# Patient Record
Sex: Male | Born: 1970 | Race: Black or African American | Hispanic: No | Marital: Married | Smoking: Current every day smoker
Health system: Southern US, Community
[De-identification: ages and names within clinical notes are randomized; demographics above are authoritative.]

## PROBLEM LIST (undated history)

## (undated) DIAGNOSIS — I10 Essential (primary) hypertension: Secondary | ICD-10-CM

## (undated) DIAGNOSIS — Z8679 Personal history of other diseases of the circulatory system: Secondary | ICD-10-CM

## (undated) DIAGNOSIS — F1910 Other psychoactive substance abuse, uncomplicated: Secondary | ICD-10-CM

## (undated) DIAGNOSIS — I480 Paroxysmal atrial fibrillation: Secondary | ICD-10-CM

## (undated) DIAGNOSIS — E781 Pure hyperglyceridemia: Secondary | ICD-10-CM

## (undated) DIAGNOSIS — Z87898 Personal history of other specified conditions: Secondary | ICD-10-CM

## (undated) HISTORY — DX: Pure hyperglyceridemia: E78.1

## (undated) HISTORY — PX: WISDOM TOOTH EXTRACTION: SHX21

## (undated) HISTORY — DX: Personal history of other specified conditions: Z87.898

## (undated) HISTORY — DX: Personal history of other diseases of the circulatory system: Z86.79

## (undated) HISTORY — PX: CARDIOVERSION: SHX1299

---

## 2000-04-10 ENCOUNTER — Emergency Department (HOSPITAL_COMMUNITY): Admission: EM | Admit: 2000-04-10 | Discharge: 2000-04-10 | Payer: Self-pay | Admitting: Emergency Medicine

## 2000-04-10 ENCOUNTER — Encounter: Payer: Self-pay | Admitting: Emergency Medicine

## 2001-05-20 ENCOUNTER — Emergency Department (HOSPITAL_COMMUNITY): Admission: EM | Admit: 2001-05-20 | Discharge: 2001-05-20 | Payer: Self-pay

## 2002-03-16 ENCOUNTER — Emergency Department (HOSPITAL_COMMUNITY): Admission: EM | Admit: 2002-03-16 | Discharge: 2002-03-16 | Payer: Self-pay | Admitting: Emergency Medicine

## 2002-03-16 ENCOUNTER — Encounter: Payer: Self-pay | Admitting: *Deleted

## 2002-04-27 ENCOUNTER — Ambulatory Visit (HOSPITAL_COMMUNITY): Admission: RE | Admit: 2002-04-27 | Discharge: 2002-04-27 | Payer: Self-pay | Admitting: *Deleted

## 2002-04-27 ENCOUNTER — Encounter: Payer: Self-pay | Admitting: *Deleted

## 2010-02-13 ENCOUNTER — Emergency Department (HOSPITAL_COMMUNITY): Admission: EM | Admit: 2010-02-13 | Discharge: 2010-02-13 | Payer: Self-pay | Admitting: Emergency Medicine

## 2010-07-04 LAB — CBC
HCT: 45.2 % (ref 39.0–52.0)
Hemoglobin: 15.8 g/dL (ref 13.0–17.0)
MCH: 31.7 pg (ref 26.0–34.0)
MCHC: 35 g/dL (ref 30.0–36.0)
MCV: 90.8 fL (ref 78.0–100.0)
Platelets: 256 10*3/uL (ref 150–400)
RBC: 4.98 MIL/uL (ref 4.22–5.81)
RDW: 14.6 % (ref 11.5–15.5)
WBC: 8 10*3/uL (ref 4.0–10.5)

## 2010-07-04 LAB — COMPREHENSIVE METABOLIC PANEL
AST: 23 U/L (ref 0–37)
Albumin: 3.9 g/dL (ref 3.5–5.2)
Alkaline Phosphatase: 41 U/L (ref 39–117)
Chloride: 105 mEq/L (ref 96–112)
Creatinine, Ser: 1.17 mg/dL (ref 0.4–1.5)
GFR calc Af Amer: >60 mL/min (ref >60)
Potassium: 4.1 mEq/L (ref 3.5–5.1)
Total Bilirubin: 0.6 mg/dL (ref 0.3–1.2)
Total Protein: 6.4 g/dL (ref 6.0–8.3)

## 2010-07-04 LAB — CK TOTAL AND CKMB (NOT AT ARMC)
CK, MB: 2.5 ng/mL (ref 0.3–4.0)
Relative Index: 0.4 (ref 0.0–2.5)
Total CK: 659 U/L — ABNORMAL HIGH (ref 7–232)

## 2010-07-04 LAB — DIFFERENTIAL
Basophils Absolute: 0.1 10*3/uL (ref 0.0–0.1)
Eosinophils Relative: 2 % (ref 0–5)
Lymphocytes Relative: 43 % (ref 12–46)
Monocytes Absolute: 0.7 10*3/uL (ref 0.1–1.0)
Monocytes Relative: 9 % (ref 3–12)

## 2010-07-04 LAB — TROPONIN I: Troponin I: 0.01 ng/mL (ref 0.00–0.06)

## 2010-07-04 LAB — TSH: TSH: 1.045 u[IU]/mL (ref 0.350–4.500)

## 2010-07-04 LAB — APTT: aPTT: 32 seconds (ref 24–37)

## 2010-07-04 LAB — PROTIME-INR: Prothrombin Time: 13.5 seconds (ref 11.6–15.2)

## 2011-07-31 ENCOUNTER — Encounter (HOSPITAL_BASED_OUTPATIENT_CLINIC_OR_DEPARTMENT_OTHER): Payer: Self-pay | Admitting: *Deleted

## 2011-07-31 ENCOUNTER — Emergency Department (INDEPENDENT_AMBULATORY_CARE_PROVIDER_SITE_OTHER): Payer: 59

## 2011-07-31 ENCOUNTER — Inpatient Hospital Stay (HOSPITAL_BASED_OUTPATIENT_CLINIC_OR_DEPARTMENT_OTHER)
Admission: EM | Admit: 2011-07-31 | Discharge: 2011-08-01 | DRG: 309 | Disposition: A | Discharge status: Home / Self Care | Payer: 59 | Attending: Internal Medicine | Admitting: Internal Medicine

## 2011-07-31 DIAGNOSIS — I480 Paroxysmal atrial fibrillation: Secondary | ICD-10-CM | POA: Diagnosis present

## 2011-07-31 DIAGNOSIS — F1910 Other psychoactive substance abuse, uncomplicated: Secondary | ICD-10-CM | POA: Diagnosis present

## 2011-07-31 DIAGNOSIS — F1410 Cocaine abuse, uncomplicated: Secondary | ICD-10-CM | POA: Diagnosis present

## 2011-07-31 DIAGNOSIS — I10 Essential (primary) hypertension: Secondary | ICD-10-CM | POA: Diagnosis present

## 2011-07-31 DIAGNOSIS — F17200 Nicotine dependence, unspecified, uncomplicated: Secondary | ICD-10-CM | POA: Diagnosis present

## 2011-07-31 DIAGNOSIS — N179 Acute kidney failure, unspecified: Secondary | ICD-10-CM | POA: Diagnosis present

## 2011-07-31 DIAGNOSIS — I4891 Unspecified atrial fibrillation: Principal | ICD-10-CM | POA: Diagnosis present

## 2011-07-31 DIAGNOSIS — R0602 Shortness of breath: Secondary | ICD-10-CM

## 2011-07-31 DIAGNOSIS — E860 Dehydration: Secondary | ICD-10-CM | POA: Diagnosis present

## 2011-07-31 HISTORY — DX: Essential (primary) hypertension: I10

## 2011-07-31 LAB — DIFFERENTIAL
Basophils Absolute: 0 10*3/uL (ref 0.0–0.1)
Basophils Relative: 1 % (ref 0–1)
Eosinophils Absolute: 0.3 10*3/uL (ref 0.0–0.7)
Monocytes Relative: 10 % (ref 3–12)
Neutro Abs: 3.7 10*3/uL (ref 1.7–7.7)
Neutrophils Relative %: 42 % — ABNORMAL LOW (ref 43–77)

## 2011-07-31 LAB — CBC
Hemoglobin: 16.9 g/dL (ref 13.0–17.0)
MCH: 31.8 pg (ref 26.0–34.0)
MCHC: 36.9 g/dL — ABNORMAL HIGH (ref 30.0–36.0)
Platelets: 277 10*3/uL (ref 150–400)
RDW: 13.5 % (ref 11.5–15.5)

## 2011-07-31 LAB — RAPID URINE DRUG SCREEN, HOSP PERFORMED
Cocaine: POSITIVE — AB
Opiates: NOT DETECTED
Tetrahydrocannabinol: NOT DETECTED

## 2011-07-31 LAB — BASIC METABOLIC PANEL
Chloride: 99 mEq/L (ref 96–112)
GFR calc Af Amer: 48 mL/min — ABNORMAL LOW (ref >90)
GFR calc non Af Amer: 42 mL/min — ABNORMAL LOW (ref >90)
Potassium: 3.7 mEq/L (ref 3.5–5.1)
Sodium: 134 mEq/L — ABNORMAL LOW (ref 135–145)

## 2011-07-31 LAB — PROTIME-INR
INR: 1.15 (ref 0.00–1.49)
Prothrombin Time: 14.9 seconds (ref 11.6–15.2)

## 2011-07-31 LAB — APTT: aPTT: 33 seconds (ref 24–37)

## 2011-07-31 MED ORDER — SODIUM CHLORIDE 0.9 % IV BOLUS (SEPSIS)
1000.0000 mL | Freq: Once | INTRAVENOUS | Status: AC
  Administered 2011-07-31: 1000 mL via INTRAVENOUS

## 2011-07-31 MED ORDER — HEPARIN (PORCINE) IN NACL 100-0.45 UNIT/ML-% IJ SOLN
1000.0000 [IU]/h | INTRAMUSCULAR | Status: DC
  Administered 2011-07-31 – 2011-08-01 (×2): 1000 [IU]/h via INTRAVENOUS
  Filled 2011-07-31 (×2): qty 250

## 2011-07-31 MED ORDER — DILTIAZEM HCL 100 MG IV SOLR
INTRAVENOUS | Status: AC
  Filled 2011-07-31: qty 100

## 2011-07-31 MED ORDER — DILTIAZEM HCL 25 MG/5ML IV SOLN
INTRAVENOUS | Status: AC
  Administered 2011-07-31: 20 mg via INTRAVENOUS
  Filled 2011-07-31: qty 5

## 2011-07-31 MED ORDER — HEPARIN BOLUS VIA INFUSION
4000.0000 [IU] | Freq: Once | INTRAVENOUS | Status: AC
  Administered 2011-07-31: 4000 [IU] via INTRAVENOUS

## 2011-07-31 MED ORDER — DILTIAZEM HCL 100 MG IV SOLR
5.0000 mg/h | INTRAVENOUS | Status: DC
  Administered 2011-07-31: 5 mg/h via INTRAVENOUS

## 2011-07-31 MED ORDER — DILTIAZEM HCL 50 MG/10ML IV SOLN
20.0000 mg | Freq: Once | INTRAVENOUS | Status: AC
  Administered 2011-07-31: 20 mg via INTRAVENOUS
  Filled 2011-07-31: qty 4

## 2011-07-31 NOTE — ED Notes (Signed)
HR decreased from 150bpm to 90 after cardizem bolus. Bp stable at 109/74. Cardizem infusion started. Pt states SOB is starting to improve.

## 2011-07-31 NOTE — ED Notes (Signed)
Pt admits to relapsing on drugs/alcohol over the past 5 days. States he last drank alcohol and smoked crack cocaine yesterday.

## 2011-07-31 NOTE — ED Notes (Signed)
Pt currently in a normal sinus rhythm of 85bpm

## 2011-07-31 NOTE — ED Provider Notes (Signed)
History     CSN: [id number]  Arrival date & time 07/31/11  2120   First MD Initiated Contact with Patient 07/31/11 2133      Chief Complaint  Patient presents with  . Shortness of Breath    (Consider location/radiation/quality/duration/timing/severity/associated sxs/prior treatment) HPI Comments: 41 year old male history of atrial fibrillation presents with palpitations and shortness of breath since yesterday. Patient states symptoms began last evening after using crack cocaine and alcohol. He has been noncompliant with his medications including Benicar, diltiazem, Coumadin. States he is not taking Coumadin in over a year. He denies chest pain. He has no headache. He has some mild lightheadedness. If the patient at [facility] however has not seen him in several months. Currently does not have a primary care physician.  The history is provided by the patient. No language interpreter was used.    Past Medical History  Diagnosis Date  . Atrial fibrillation, chronic   . Hypertension     History reviewed. No pertinent past surgical history.  History reviewed. No pertinent family history.  History  Substance Use Topics  . Smoking status: Current Everyday Smoker  . Smokeless tobacco: Not on file  . Alcohol Use: Yes     pt has hx of alcohol abuse      Review of Systems  Constitutional: Negative for fever, activity change, appetite change and fatigue.  HENT: Negative for congestion, sore throat, rhinorrhea, neck pain and neck stiffness.   Respiratory: Positive for shortness of breath. Negative for cough.   Cardiovascular: Positive for palpitations. Negative for chest pain.  Gastrointestinal: Negative for nausea, vomiting and abdominal pain.  Genitourinary: Negative for dysuria, urgency, frequency and flank pain.  Musculoskeletal: Negative for myalgias, back pain and arthralgias.  Neurological: Positive for light-headedness. Negative for dizziness, weakness,  numbness and headaches.  All other systems reviewed and are negative.    Allergies  Review of patient's allergies indicates no known allergies.  Home Medications   Current Outpatient Rx  Name Route Sig Dispense Refill  . WELLBUTRIN XL PO Oral Take 1 tablet by mouth daily.    Marland Kitchen DILTIAZEM HCL CD PO Oral Take 1 tablet by mouth daily.    Marland Kitchen FLUTICASONE PROPIONATE 50 MCG/ACT NA SUSP Nasal Place 1 spray into the nose daily.    Marland Kitchen LOSARTAN POTASSIUM PO Oral Take 1 tablet by mouth daily.    Marland Kitchen LOVAZA PO Oral Take 1 capsule by mouth daily.      BP 101/68  Pulse 76  Resp 18  Ht 5\' 11"  (1.803 m)  Wt 225 lb (102.059 kg)  BMI 31.38 kg/m2  SpO2 98%  Physical Exam  Nursing note and vitals reviewed. Constitutional: He is oriented to person, place, and time. He appears well-developed and well-nourished.  HENT:  Head: Normocephalic and atraumatic.  Mouth/Throat: Oropharynx is clear and moist.  Eyes: Conjunctivae and EOM are normal. Pupils are equal, round, and reactive to light.  Neck: Normal range of motion. Neck supple.  Cardiovascular: Normal heart sounds and intact distal pulses.  Exam reveals no gallop and no friction rub.   No murmur heard.      Tachycardic rate and irreg irreg rhythm  Pulmonary/Chest: Effort normal and breath sounds normal. No respiratory distress. He exhibits no tenderness.  Abdominal: Soft. Bowel sounds are normal. There is no tenderness. There is no rebound and no guarding.  Musculoskeletal: Normal range of motion. He exhibits no edema and no tenderness.  Neurological: He is alert and oriented to person,  place, and time. No cranial nerve deficit.  Skin: Skin is warm and dry. No rash noted.    ED Course  Procedures (including critical care time)   Date: 07/31/2011  Rate: 148  Rhythm: atrial fibrillation  QRS Axis: normal  Intervals: normal  ST/T Wave abnormalities: normal  Conduction Disutrbances:none  Narrative Interpretation:   Old EKG Reviewed: none  available  CRITICAL CARE Performed by: Dayton Bailiff   Total critical care time: 45 min  Critical care time was exclusive of separately billable procedures and treating other patients.  Critical care was necessary to treat or prevent imminent or life-threatening deterioration.  Critical care was time spent personally by me on the following activities: development of treatment plan with patient and/or surrogate as well as nursing, discussions with consultants, evaluation of patient's response to treatment, examination of patient, obtaining history from patient or surrogate, ordering and performing treatments and interventions, ordering and review of laboratory studies, ordering and review of radiographic studies, pulse oximetry and re-evaluation of patient's condition.  Labs Reviewed  CBC - Abnormal; Notable for the following:    MCHC 36.9 (*)    All other components within normal limits  DIFFERENTIAL - Abnormal; Notable for the following:    Neutrophils Relative 42 (*)    All other components within normal limits  BASIC METABOLIC PANEL - Abnormal; Notable for the following:    Sodium 134 (*)    CO2 17 (*)    Glucose, Bld 109 (*)    Creatinine, Ser 1.90 (*)    GFR calc non Af Amer 42 (*)    GFR calc Af Amer 48 (*)    All other components within normal limits  PROTIME-INR  TROPONIN I  APTT  URINE RAPID DRUG SCREEN (HOSP PERFORMED)   Dg Chest Port 1 View  07/31/2011  *RADIOLOGY REPORT*  Clinical Data: Shortness of breath.  PORTABLE CHEST - 1 VIEW  Comparison: Two-view chest 02/13/2010.  Findings: Low lung volumes exaggerate the heart size.  No focal airspace disease is present.  The visualized soft tissues and bony thorax are unremarkable.  IMPRESSION: Negative chest.  Original Report Authenticated By: Jamesetta Orleans. MATTERN, M.D.     1. Atrial fibrillation with RVR       MDM  AFib with rapid ventricular response. Placed on a diltiazem drip following a bolus. Converted to  normal sinus rhythm. The trip was then stopped. He'll be admitted for further evaluation and treatment. Placed on heparin drip.        Dayton Bailiff, MD 07/31/11 2258

## 2011-07-31 NOTE — ED Notes (Signed)
Pt unable to provide a urine specimen at this time

## 2011-07-31 NOTE — ED Notes (Signed)
Pt c/o SOB x 2 days, HX afib

## 2011-08-01 ENCOUNTER — Encounter (HOSPITAL_COMMUNITY): Payer: Self-pay | Admitting: *Deleted

## 2011-08-01 ENCOUNTER — Other Ambulatory Visit: Payer: Self-pay

## 2011-08-01 DIAGNOSIS — N179 Acute kidney failure, unspecified: Secondary | ICD-10-CM | POA: Diagnosis present

## 2011-08-01 DIAGNOSIS — F1910 Other psychoactive substance abuse, uncomplicated: Secondary | ICD-10-CM | POA: Diagnosis present

## 2011-08-01 DIAGNOSIS — I517 Cardiomegaly: Secondary | ICD-10-CM

## 2011-08-01 DIAGNOSIS — I480 Paroxysmal atrial fibrillation: Secondary | ICD-10-CM | POA: Diagnosis present

## 2011-08-01 DIAGNOSIS — I4891 Unspecified atrial fibrillation: Secondary | ICD-10-CM | POA: Diagnosis present

## 2011-08-01 LAB — COMPREHENSIVE METABOLIC PANEL
ALT: 13 U/L (ref 0–53)
BUN: 18 mg/dL (ref 6–23)
CO2: 21 mEq/L (ref 19–32)
Calcium: 8.5 mg/dL (ref 8.4–10.5)
Creatinine, Ser: 1.43 mg/dL — ABNORMAL HIGH (ref 0.50–1.35)
GFR calc Af Amer: 68 mL/min — ABNORMAL LOW (ref >90)
GFR calc non Af Amer: 59 mL/min — ABNORMAL LOW (ref >90)
Glucose, Bld: 89 mg/dL (ref 70–99)
Sodium: 137 mEq/L (ref 135–145)

## 2011-08-01 LAB — HEMOGLOBIN A1C
Hgb A1c MFr Bld: 5.5 % (ref <5.7)
Mean Plasma Glucose: 111 mg/dL (ref <117)

## 2011-08-01 LAB — CBC
Hemoglobin: 14.3 g/dL (ref 13.0–17.0)
MCHC: 35.5 g/dL (ref 30.0–36.0)
WBC: 7.8 10*3/uL (ref 4.0–10.5)

## 2011-08-01 LAB — CARDIAC PANEL(CRET KIN+CKTOT+MB+TROPI)
CK, MB: 3.4 ng/mL (ref 0.3–4.0)
Total CK: 1168 U/L — ABNORMAL HIGH (ref 7–232)
Total CK: 1212 U/L — ABNORMAL HIGH (ref 7–232)

## 2011-08-01 MED ORDER — SODIUM CHLORIDE 0.9 % IJ SOLN
3.0000 mL | Freq: Two times a day (BID) | INTRAMUSCULAR | Status: DC
  Administered 2011-08-01: 3 mL via INTRAVENOUS

## 2011-08-01 MED ORDER — DILTIAZEM HCL 60 MG PO TABS
60.0000 mg | ORAL_TABLET | Freq: Three times a day (TID) | ORAL | Status: DC
  Administered 2011-08-01: 60 mg via ORAL
  Filled 2011-08-01 (×4): qty 1

## 2011-08-01 MED ORDER — GUAIFENESIN-DM 100-10 MG/5ML PO SYRP: 5.0000 mL | ORAL_SOLUTION | ORAL | Status: DC | PRN

## 2011-08-01 MED ORDER — SODIUM CHLORIDE 0.9 % IV SOLN
INTRAVENOUS | Status: AC
  Administered 2011-08-01: 03:00:00 via INTRAVENOUS

## 2011-08-01 MED ORDER — PANTOPRAZOLE SODIUM 40 MG PO TBEC: 40.0000 mg | DELAYED_RELEASE_TABLET | Freq: Every day | ORAL | Status: DC

## 2011-08-01 MED ORDER — ONDANSETRON HCL 4 MG PO TABS: 4.0000 mg | ORAL_TABLET | Freq: Four times a day (QID) | ORAL | Status: DC | PRN

## 2011-08-01 MED ORDER — FOLIC ACID 1 MG PO TABS: 1.0000 mg | ORAL_TABLET | Freq: Every day | ORAL | Status: AC

## 2011-08-01 MED ORDER — VITAMIN B-12 100 MCG PO TABS
100.0000 ug | ORAL_TABLET | Freq: Every day | ORAL | Status: DC
  Filled 2011-08-01 (×2): qty 1

## 2011-08-01 MED ORDER — ALUM & MAG HYDROXIDE-SIMETH 200-200-20 MG/5ML PO SUSP: 30.0000 mL | Freq: Four times a day (QID) | ORAL | Status: DC | PRN

## 2011-08-01 MED ORDER — DOCUSATE SODIUM 100 MG PO CAPS
100.0000 mg | ORAL_CAPSULE | Freq: Two times a day (BID) | ORAL | Status: DC
  Administered 2011-08-01 (×2): 100 mg via ORAL
  Filled 2011-08-01: qty 1

## 2011-08-01 MED ORDER — ADULT MULTIVITAMIN W/MINERALS CH
1.0000 | ORAL_TABLET | Freq: Every day | ORAL | Status: DC
  Filled 2011-08-01: qty 1

## 2011-08-01 MED ORDER — CYANOCOBALAMIN 100 MCG PO TABS: 100.0000 ug | ORAL_TABLET | Freq: Every day | ORAL | Status: AC

## 2011-08-01 MED ORDER — ASPIRIN EC 81 MG PO TBEC
81.0000 mg | DELAYED_RELEASE_TABLET | Freq: Every day | ORAL | Status: DC
  Administered 2011-08-01: 81 mg via ORAL
  Filled 2011-08-01: qty 1

## 2011-08-01 MED ORDER — ONDANSETRON HCL 4 MG/2ML IJ SOLN: 4.0000 mg | Freq: Four times a day (QID) | INTRAMUSCULAR | Status: DC | PRN

## 2011-08-01 MED ORDER — ADULT MULTIVITAMIN W/MINERALS CH: 1.0000 | ORAL_TABLET | Freq: Every day | ORAL | Status: DC

## 2011-08-01 MED ORDER — ASPIRIN 81 MG PO TBEC: 81.0000 mg | DELAYED_RELEASE_TABLET | Freq: Every day | ORAL | Status: AC

## 2011-08-01 MED ORDER — FOLIC ACID 1 MG PO TABS
1.0000 mg | ORAL_TABLET | Freq: Every day | ORAL | Status: DC
  Filled 2011-08-01: qty 1

## 2011-08-01 MED ORDER — ALBUTEROL SULFATE (5 MG/ML) 0.5% IN NEBU: 2.5000 mg | INHALATION_SOLUTION | RESPIRATORY_TRACT | Status: DC | PRN

## 2011-08-01 MED ORDER — ACETAMINOPHEN 650 MG RE SUPP: 650.0000 mg | Freq: Four times a day (QID) | RECTAL | Status: DC | PRN

## 2011-08-01 MED ORDER — ACETAMINOPHEN 325 MG PO TABS: 650.0000 mg | ORAL_TABLET | Freq: Four times a day (QID) | ORAL | Status: DC | PRN

## 2011-08-01 NOTE — Consult Note (Signed)
Pt smokes 1 ppd and says he's tried chantix before but wasn't successful on it as he had too many side effects on it. He is in action stage and wants to quit but needs help. Recommended 21 mg patch to start with. Discussed patch use instructions and how to taper. Referred to 1-800 quit now for f/u and support. Discussed oral fixation substitutes, second hand smoke and in home smoking policy. Reviewed and gave pt Written education/contact information.

## 2011-08-01 NOTE — Consult Note (Signed)
[facility]       CONSULTATION NOTE   Reason for Consult: Shortness of breath, atrial fibrillation with rapid ventricular response  Requesting Physician: Dr. Adela Glimpse  HPI: This is a 41 y.o. male with a past medical history significant for polysubstance abuse including cocaine and alcohol, atrial fibrillation, medication and appointment noncompliance. He's not been seen in our office in the past 2 years per records. He was last seen in 2011 by Dr. Lynnea Ferrier with new onset atrial fibrillation. At that time he underwent DC cardioversion, as he was in atrial fibrillation less than 12 hours. He was started on Cardizem CD 120 mg daily and given his young age and few risk factors for atrial fibrillation not anticoagulated. He now returns with recurrent atrial fibrillation rapid ventricular response. This is secondary to 6 days of alcoholic binging. Rate was controlled in the emergency department on diltiazem and is currently converted to sinus rhythm. 2-D echocardiogram is pending. There consult to evaluate and manage his atrial fibrillation.  PMHx:  Past Medical History  Diagnosis Date  . Atrial fibrillation, chronic   . Hypertension    History reviewed. No pertinent past surgical history.  FAMHx: History reviewed. No pertinent family history.  SOCHx:  reports that he has been smoking.  He does not have any smokeless tobacco history on file. He reports that he drinks alcohol. He reports that he uses illicit drugs (Cocaine).  ALLERGIES: No Known Allergies  ROS: A comprehensive review of systems was negative except for: Respiratory: positive for dyspnea on exertion Cardiovascular: positive for dyspnea and irregular heart beat  HOME MEDICATIONS: Prescriptions prior to admission  Medication Sig Dispense Refill  . BuPROPion HCl (WELLBUTRIN XL PO) Take 1 tablet by mouth daily.      . Diltiazem HCl Coated Beads (DILTIAZEM HCL CD PO) Take 1 tablet by mouth  daily.      . fluticasone (FLONASE) 50 MCG/ACT nasal spray Place 1 spray into the nose daily.      Marland Kitchen LOSARTAN POTASSIUM PO Take 1 tablet by mouth daily.      . Omega-3-acid Ethyl Esters (LOVAZA PO) Take 1 capsule by mouth daily.        HOSPITAL MEDICATIONS: Prior to Admission:  Prescriptions prior to admission  Medication Sig Dispense Refill  . BuPROPion HCl (WELLBUTRIN XL PO) Take 1 tablet by mouth daily.      . Diltiazem HCl Coated Beads (DILTIAZEM HCL CD PO) Take 1 tablet by mouth daily.      . fluticasone (FLONASE) 50 MCG/ACT nasal spray Place 1 spray into the nose daily.      Marland Kitchen LOSARTAN POTASSIUM PO Take 1 tablet by mouth daily.      . Omega-3-acid Ethyl Esters (LOVAZA PO) Take 1 capsule by mouth daily.        VITALS: Blood pressure 112/72, pulse 74, temperature 98.6 F (37 C), temperature source Oral, resp. rate 16, height 5\' 11"  (1.803 m), weight 102.8 kg (226 lb 10.1 oz), SpO2 92.00%.  PHYSICAL EXAM: General appearance: alert and no distress Neck: no adenopathy, no carotid bruit, no JVD, supple, symmetrical, trachea midline and thyroid not enlarged, symmetric, no tenderness/mass/nodules Lungs: clear to auscultation bilaterally Heart: regular rate and rhythm, S1, S2 normal, no murmur, click, rub or gallop Abdomen: soft, non-tender; bowel sounds normal; no masses,  no organomegaly Extremities: extremities normal, atraumatic, no cyanosis or edema Pulses: 2+ and symmetric Skin: Skin color, texture, turgor normal. No rashes or lesions  Neurologic: Grossly normal  LABS: Results for orders placed during the hospital encounter of 07/31/11 (from the past 48 hour(s))  CBC     Status: Abnormal   Collection Time   07/31/11  9:42 PM      Component Value Range Comment   WBC 8.8  4.0 - 10.5 (K/uL)    RBC 5.31  4.22 - 5.81 (MIL/uL)    Hemoglobin 16.9  13.0 - 17.0 (g/dL)    HCT 91.4  78.2 - 95.6 (%)    MCV 86.3  78.0 - 100.0 (fL)    MCH 31.8  26.0 - 34.0 (pg)    MCHC 36.9 (*) 30.0 -  36.0 (g/dL)    RDW 21.3  08.6 - 57.8 (%)    Platelets 277  150 - 400 (K/uL)   DIFFERENTIAL     Status: Abnormal   Collection Time   07/31/11  9:42 PM      Component Value Range Comment   Neutrophils Relative 42 (*) 43 - 77 (%)    Neutro Abs 3.7  1.7 - 7.7 (K/uL)    Lymphocytes Relative 44  12 - 46 (%)    Lymphs Abs 3.9  0.7 - 4.0 (K/uL)    Monocytes Relative 10  3 - 12 (%)    Monocytes Absolute 0.9  0.1 - 1.0 (K/uL)    Eosinophils Relative 4  0 - 5 (%)    Eosinophils Absolute 0.3  0.0 - 0.7 (K/uL)    Basophils Relative 1  0 - 1 (%)    Basophils Absolute 0.0  0.0 - 0.1 (K/uL)   BASIC METABOLIC PANEL     Status: Abnormal   Collection Time   07/31/11  9:42 PM      Component Value Range Comment   Sodium 134 (*) 135 - 145 (mEq/L)    Potassium 3.7  3.5 - 5.1 (mEq/L)    Chloride 99  96 - 112 (mEq/L)    CO2 17 (*) 19 - 32 (mEq/L)    Glucose, Bld 109 (*) 70 - 99 (mg/dL)    BUN 21  6 - 23 (mg/dL)    Creatinine, Ser 4.69 (*) 0.50 - 1.35 (mg/dL)    Calcium 9.8  8.4 - 10.5 (mg/dL)    GFR calc non Af Amer 42 (*) >90 (mL/min)    GFR calc Af Amer 48 (*) >90 (mL/min)   PROTIME-INR     Status: Normal   Collection Time   07/31/11  9:42 PM      Component Value Range Comment   Prothrombin Time 14.9  11.6 - 15.2 (seconds)    INR 1.15  0.00 - 1.49    TROPONIN I     Status: Normal   Collection Time   07/31/11  9:42 PM      Component Value Range Comment   Troponin I <0.30  <0.30 (ng/mL)   APTT     Status: Normal   Collection Time   07/31/11  9:42 PM      Component Value Range Comment   aPTT 33  24 - 37 (seconds)   URINE RAPID DRUG SCREEN (HOSP PERFORMED)     Status: Abnormal   Collection Time   07/31/11 11:13 PM      Component Value Range Comment   Opiates NONE DETECTED  NONE DETECTED     Cocaine POSITIVE (*) NONE DETECTED     Benzodiazepines NONE DETECTED  NONE DETECTED     Amphetamines NONE DETECTED  NONE DETECTED  Tetrahydrocannabinol NONE DETECTED  NONE DETECTED     Barbiturates NONE  DETECTED  NONE DETECTED    MAGNESIUM     Status: Normal   Collection Time   08/01/11  2:57 AM      Component Value Range Comment   Magnesium 2.1  1.5 - 2.5 (mg/dL)   PHOSPHORUS     Status: Normal   Collection Time   08/01/11  2:57 AM      Component Value Range Comment   Phosphorus 2.8  2.3 - 4.6 (mg/dL)   COMPREHENSIVE METABOLIC PANEL     Status: Abnormal   Collection Time   08/01/11  2:57 AM      Component Value Range Comment   Sodium 137  135 - 145 (mEq/L)    Potassium 3.7  3.5 - 5.1 (mEq/L)    Chloride 106  96 - 112 (mEq/L)    CO2 21  19 - 32 (mEq/L)    Glucose, Bld 89  70 - 99 (mg/dL)    BUN 18  6 - 23 (mg/dL)    Creatinine, Ser 1.91 (*) 0.50 - 1.35 (mg/dL)    Calcium 8.5  8.4 - 10.5 (mg/dL)    Total Protein 6.3  6.0 - 8.3 (g/dL)    Albumin 3.7  3.5 - 5.2 (g/dL)    AST 19  0 - 37 (U/L)    ALT 13  0 - 53 (U/L)    Alkaline Phosphatase 40  39 - 117 (U/L)    Total Bilirubin 0.3  0.3 - 1.2 (mg/dL)    GFR calc non Af Amer 59 (*) >90 (mL/min)    GFR calc Af Amer 68 (*) >90 (mL/min)   CBC     Status: Normal   Collection Time   08/01/11  2:57 AM      Component Value Range Comment   WBC 7.8  4.0 - 10.5 (K/uL)    RBC 4.55  4.22 - 5.81 (MIL/uL)    Hemoglobin 14.3  13.0 - 17.0 (g/dL)    HCT 47.8  29.5 - 62.1 (%)    MCV 88.6  78.0 - 100.0 (fL)    MCH 31.4  26.0 - 34.0 (pg)    MCHC 35.5  30.0 - 36.0 (g/dL)    RDW 30.8  65.7 - 84.6 (%)    Platelets 236  150 - 400 (K/uL)   CARDIAC PANEL(CRET KIN+CKTOT+MB+TROPI)     Status: Abnormal   Collection Time   08/01/11  2:57 AM      Component Value Range Comment   Total CK 1168 (*) 7 - 232 (U/L)    CK, MB 3.4  0.3 - 4.0 (ng/mL)    Troponin I <0.30  <0.30 (ng/mL)    Relative Index 0.3  0.0 - 2.5    CREATININE, URINE, RANDOM     Status: Normal   Collection Time   08/01/11  8:33 AM      Component Value Range Comment   Creatinine, Urine 254.51     SODIUM, URINE, RANDOM     Status: Normal   Collection Time   08/01/11  8:33 AM      Component  Value Range Comment   Sodium, Ur 38     CARDIAC PANEL(CRET KIN+CKTOT+MB+TROPI)     Status: Abnormal   Collection Time   08/01/11  8:33 AM      Component Value Range Comment   Total CK 1212 (*) 7 - 232 (U/L)    CK, MB 3.3  0.3 -  4.0 (ng/mL)    Troponin I <0.30  <0.30 (ng/mL)    Relative Index 0.3  0.0 - 2.5      IMAGING: Dg Chest Port 1 View  07/31/2011  *RADIOLOGY REPORT*  Clinical Data: Shortness of breath.  PORTABLE CHEST - 1 VIEW  Comparison: Two-view chest 02/13/2010.  Findings: Low lung volumes exaggerate the heart size.  No focal airspace disease is present.  The visualized soft tissues and bony thorax are unremarkable.  IMPRESSION: Negative chest.  Original Report Authenticated By: Jamesetta Orleans. MATTERN, M.D.    IMPRESSION: 1. Alcohol-related atrial fibrillation 2. Hypertension 3. Polysubstance abuse  RECOMMENDATION: 1. This is likely acute alcohol related atrial fibrillation, secondary to binging, although he had several episodes over the past 2 years which were short-lived.  His CHADS2VASC score is 1 and he is at low risk of stroke. He was on coumadin after cardioversion, which was not discontinued.  He is currently in sinus and I agree with increasing diltiazem since he broke through his home dose. There is no chest pain no indication for further rule out. Will discontinue heparin. A review his 2-D echocardiogram today but most likely he can be discharged today.  We'll arrange followup with me in the office in 2 weeks.  I would add a PPI and folate, B-12, MVI for ongoing alcohol use.  Time Spent Directly with Patient: 30  minutes  Chrystie Nose, MD, [facility] Attending Cardiologist [facility]  Americus Scheurich C 08/01/2011, 10:19 AM

## 2011-08-01 NOTE — Progress Notes (Signed)
Pt. Discharged 08/01/2011  3:27 PM Discharge instructions reviewed with patient/family. Patient/family verbalized understanding. All Rx's given. Questions answered as needed. Pt. Discharged to home with family/self.  Ave Filter

## 2011-08-01 NOTE — Discharge Summary (Signed)
Physician Discharge Summary  Patient ID: Raymond Short MRN: [id number] DOB/AGE: 1970-06-25 41 y.o.  Admit date: 07/31/2011 Discharge date: 08/01/2011  Admission Diagnoses:  Discharge Diagnoses:  Principal Problem:  *Atrial fibrillation with RVR Active Problems:  Polysubstance abuse  Acute renal failure   Discharged Condition: stable  Hospital Course:  This is a 41 y.o. male with a past medical history significant for polysubstance abuse including cocaine and alcohol, atrial fibrillation, medication and appointment noncompliance. He's not been seen in our office in the past 2 years per records. He was last seen in 2011 by Dr. Lynnea Ferrier with new onset atrial fibrillation. At that time he underwent DC cardioversion, as he was in atrial fibrillation less than 12 hours. He was started on Cardizem CD 120 mg daily and given his young age and few risk factors for atrial fibrillation not anticoagulated. He now returns with recurrent atrial fibrillation rapid ventricular response. This is secondary to 6 days of alcoholic binging. Rate was controlled in the emergency department on diltiazem and is currently converted to sinus rhythm.  .  The urine drug screen was positive for Cocaine.  2D echo revealed Normal LV function and grade 2 diastolic dysfunction, EF 55-60%.  The patient will be discharged in stable condition on added B12, MV, Folate.  Follow-up in 2 weeks.  Consults: None  Significant Diagnostic Studies:  Study Conclusions  - Left ventricle: The cavity size was normal. Wall thickness was increased in a pattern of moderate LVH. Systolic function was normal. The estimated ejection fraction was in the range of 55% to 60%. Wall motion was normal; there were no regional wall motion abnormalities. Features are consistent with a pseudonormal left ventricular filling pattern, with concomitant abnormal relaxation and increased filling pressure (grade 2 diastolic dysfunction). - Aortic valve:  There was no stenosis. - Aorta: Mildly dilated aortic root. - Mitral valve: Trivial regurgitation. - Left atrium: The atrium was mildly dilated. - Right ventricle: The cavity size was normal. Systolic function was normal. - Tricuspid valve: Peak RV-RA gradient: 19mm Hg (S). - Pulmonary arteries: PA peak pressure: 24mm Hg (S). - Inferior vena cava: The vessel was normal in size; the respirophasic diameter changes were in the normal range (= 50%); findings are consistent with normal central venous pressure. Impressions:  - Normal LV size and systolic function with moderate LV hypertrophy. EF 55-60%. Moderate diastolic dysfunction. Normal RV size and systolic function.  Treatments: Cardizem., B12, Folate, MV  Discharge Exam: Blood pressure 104/69, pulse 78, temperature 98.4 F (36.9 C), temperature source Oral, resp. rate 20, height 5\' 11"  (1.803 m), weight 102.8 kg (226 lb 10.1 oz), SpO2 100.00%.   Disposition: Final discharge disposition not confirmed  Discharge Orders    Future Orders Please Complete By Expires   Diet - low sodium heart healthy      Increase activity slowly      Discharge instructions      Comments:   Stop drinking alcohol! Stop using cocaine!      Medication List  As of 08/01/2011  3:19 PM   TAKE these medications         aspirin 81 MG EC tablet   Take 1 tablet (81 mg total) by mouth daily.      cyanocobalamin 100 MCG tablet   Take 1 tablet (100 mcg total) by mouth daily.      DILTIAZEM HCL CD PO   Take 1 tablet by mouth daily.      fluticasone 50 MCG/ACT nasal spray  Commonly known as: FLONASE   Place 1 spray into the nose daily.      folic acid 1 MG tablet   Commonly known as: FOLVITE   Take 1 tablet (1 mg total) by mouth daily.      LOSARTAN POTASSIUM PO   Take 1 tablet by mouth daily.      LOVAZA PO   Take 1 capsule by mouth daily.      mulitivitamin with minerals Tabs   Take 1 tablet by mouth daily.      pantoprazole 40 MG  tablet   Commonly known as: PROTONIX   Take 1 tablet (40 mg total) by mouth daily at 12 noon.      WELLBUTRIN XL PO   Take 1 tablet by mouth daily.             SignedDwana Melena 08/01/2011, 3:19 PM

## 2011-08-01 NOTE — Progress Notes (Signed)
   CARE MANAGEMENT NOTE 08/01/2011  Patient:  Raymond Short, Raymond Short   Account Number:  [id number]  Date Initiated:  08/01/2011  Documentation initiated by:  Letha Cape  Subjective/Objective Assessment:   dx afib, sob  admit- lives with spouse, pta independent.     Action/Plan:   Anticipated DC Date:  08/01/2011   Anticipated DC Plan:  HOME/SELF CARE      DC Planning Services  CM consult      Choice offered to / List presented to:             Status of service:  Completed, signed off Medicare Important Message given?   (If response is "NO", the following Medicare IM given date fields will be blank) Date Medicare IM given:   Date Additional Medicare IM given:    Discharge Disposition:  HOME/SELF CARE  Per UR Regulation:    If discussed at Long Length of Stay Meetings, dates discussed:    Comments:  08/01/11 15:55 Letha Cape RN, BSN [phone] patient lives with spouse, pta independent, per notes pt for dc to home today.

## 2011-08-01 NOTE — Progress Notes (Signed)
*  PRELIMINARY RESULTS* Echocardiogram 2D Echocardiogram has been performed.  Raymond Short 08/01/2011, 9:54 AM

## 2011-08-01 NOTE — Discharge Summary (Signed)
Raymond Kunde C. Irina Okelly, MD, FACC Attending Cardiologist [facility]  

## 2011-08-01 NOTE — ED Notes (Signed)
[facility] en route. approx out.

## 2011-08-01 NOTE — H&P (Signed)
PCP:  Valentino Saxon Cardiology: [facility] cardiology  Chief Complaint:   Short of breath  HPI: Raymond Short is a 41 y.o. male   has a past medical history of Atrial fibrillation, chronic and Hypertension.   Presented with  Shortness of breath after going on and being mid 6 days of heavy alcohol intake and crack cocaine. Patient has a history of atrial fibrillation for which he is to be seen by [place] [facility]ology. He stopped going there and stopped taking his Coumadin as he says this is too expensive. Patient is pleasant he states full understanding of the risks involved,  states instead he said that he has increase risk of developing stroke . Patient was brought in to emergency department by his wife when he developed acute onset of significant shortness of breath he was found to have atrial fibrillation with RVR and 140s. He was started on diltiazem drip and converted to sinus rhythm. At the time of his arrival to the floor his heart rate was in 70s. He does not recollect the dosing of his diltiazem he takes at home. He this episode was not associated with any chest pain  Review of Systems:    Pertinent positives include:shortness of breath at rest. palpitations.  Constitutional:  No weight loss, night sweats, Fevers, chills, fatigue, weight loss  HEENT:  No headaches, Difficulty swallowing,Tooth/dental problems,Sore throat,  No sneezing, itching, ear ache, nasal congestion, post nasal drip,  Cardio-vascular:  No chest pain, Orthopnea, PND, anasarca, dizziness,no Bilateral lower extremity swelling  GI:  No heartburn, indigestion, abdominal pain, nausea, vomiting, diarrhea, change in bowel habits, loss of appetite, melena, blood in stool, hematoemesis Resp:  no  No dyspnea on exertion, No excess mucus, no productive cough, No non-productive cough, No coughing up of blood.No change in color of mucus.No wheezing. Skin:  no rash or lesions. No jaundice GU:  no dysuria,  change in color of urine, no urgency or frequency. No straining to urinate.  No flank pain.  Musculoskeletal:  No joint pain or no joint swelling. No decreased range of motion. No back pain.  Psych:  No change in mood or affect. No depression or anxiety. No memory loss.  Neuro: no localizing neurological complaints, no tingling, no weakness, no double vision, no gait abnormality, no slurred speech, no confusion  Otherwise ROS are negative except for above, 10 systems were reviewed  Past Medical History: Past Medical History  Diagnosis Date  . Atrial fibrillation, chronic   . Hypertension    History reviewed. No pertinent past surgical history.   Medications: Prior to Admission medications   Medication Sig Start Date End Date Taking? Authorizing Provider  BuPROPion HCl (WELLBUTRIN XL PO) Take 1 tablet by mouth daily.   Yes Historical Provider, MD  Diltiazem HCl Coated Beads (DILTIAZEM HCL CD PO) Take 1 tablet by mouth daily.   Yes Historical Provider, MD  fluticasone (FLONASE) 50 MCG/ACT nasal spray Place 1 spray into the nose daily.   Yes Historical Provider, MD  LOSARTAN POTASSIUM PO Take 1 tablet by mouth daily.   Yes Historical Provider, MD  Omega-3-acid Ethyl Esters (LOVAZA PO) Take 1 capsule by mouth daily.   Yes Historical Provider, MD    Allergies:  No Known Allergies  Social History:  Ambulatory  independently  Lives at   Home with wife   reports that he has been smoking.  He does not have any smokeless tobacco history on file. He reports that he drinks alcohol. He  reports that he uses illicit drugs (Cocaine).   Family History: family history is not on file.   noncontributory  Physical Exam: Patient Vitals for the past 24 hrs:  BP Temp Temp src Pulse Resp SpO2 Height Weight  08/01/11 0441 115/66 mmHg 98.5 F (36.9 C) Oral 80  20  94 % - -  08/01/11 0158 114/71 mmHg 98.3 F (36.8 C) Oral 83  20  96 % - 102.8 kg (226 lb 10.1 oz)  08/01/11 0043 105/60 mmHg - -  78  16  99 % - -  07/31/11 2335 143/75 mmHg - - 76  18  98 % - -  07/31/11 2238 101/68 mmHg - - 76  18  98 % - -  07/31/11 2217 107/68 mmHg - - 80  18  100 % - -  07/31/11 2207 109/74 mmHg - - 89  20  100 % - -  07/31/11 2200 100/68 mmHg - - 110  20  100 % - -  07/31/11 2155 104/59 mmHg - - 150  26  100 % - -  07/31/11 2126 97/57 mmHg - - - - - - -  07/31/11 2125 - - - - 18  100 % 5\' 11"  (1.803 m) 102.059 kg (225 lb)    1. General:  in No Acute distress 2. Psychological: Alert and  Oriented 3. Head/ENT:   Moist  Mucous Membranes                          Head Non traumatic, neck supple                          Normal Dentition 4. SKIN:  decreased Skin turgor,  Skin clean Dry and intact no rash 5. Heart: Regular rate and rhythm no Murmur, Rub or gallop 6. Lungs: Clear to auscultation bilaterally, no wheezes or crackles   7. Abdomen: Soft, non-tender, Non distended 8. Lower extremities: no clubbing, cyanosis, or edema 9. Neurologically Grossly intact, moving all 4 extremities equally 10. MSK: Normal range of motion  body mass index is 31.61 kg/(m^2).   Labs on Admission:   [facility] 08/01/11 0257 07/31/11 2142  NA 137 134*  K 3.7 3.7  CL 106 99  CO2 21 17*  GLUCOSE 89 109*  BUN 18 21  CREATININE 1.43* 1.90*  CALCIUM 8.5 9.8  MG 2.1 --  PHOS 2.8 --    Basename 08/01/11 0257  AST 19  ALT 13  ALKPHOS 40  BILITOT 0.3  PROT 6.3  ALBUMIN 3.7   No results found for this basename: LIPASE:2,AMYLASE:2 in the last 72 hours  Basename 08/01/11 0257 07/31/11 2142  WBC 7.8 8.8  NEUTROABS -- 3.7  HGB 14.3 16.9  HCT 40.3 45.8  MCV 88.6 86.3  PLT 236 277    Basename 08/01/11 0257 07/31/11 2142  CKTOTAL 1168* --  CKMB 3.4 --  CKMBINDEX -- --  TROPONINI <0.30 <0.30   No results found for this basename: TSH,T4TOTAL,FREET3,T3FREE,THYROIDAB in the last 72 hours No results found for this basename: VITAMINB12:2,FOLATE:2,FERRITIN:2,TIBC:2,IRON:2,RETICCTPCT:2 in the last 72  hours Lab Results  Component Value Date   HGBA1C  Value: 5.7 (NOTE)  According to the ADA Clinical Practice Recommendations for 2011, when HbA1c is used as a screening test:   >=6.5%   Diagnostic of Diabetes Mellitus           (if abnormal result  is confirmed)  5.7-6.4%   Increased risk of developing Diabetes Mellitus  References:Diagnosis and Classification of Diabetes Mellitus,Diabetes Care,2011,34(Suppl 1):S62-S69 and Standards of Medical Care in         Diabetes - 2011,Diabetes Care,2011,34  (Suppl 1):S11-S61.* 02/13/2010    Estimated Creatinine Clearance: 81.3 ml/min (by C-G formula based on Cr of 1.43). ABG No results found for this basename: phart, pco2, po2, hco3, tco2, acidbasedef, o2sat     No results found for this basename: DDIMER     Other results:  I have pearsonaly reviewed this: ECG REPORT  Rate: 77   Rhythm: Normal sinus rhythm ST&T Change:  the skin changes    Cultures: No results found for this basename: sdes, specrequest, cult, reptstatus       Radiological Exams on Admission: Dg Chest Port 1 View  07/31/2011  *RADIOLOGY REPORT*  Clinical Data: Shortness of breath.  PORTABLE CHEST - 1 VIEW  Comparison: Two-view chest 02/13/2010.  Findings: Low lung volumes exaggerate the heart size.  No focal airspace disease is present.  The visualized soft tissues and bony thorax are unremarkable.  IMPRESSION: Negative chest.  Original Report Authenticated By: Jamesetta Orleans. MATTERN, M.D.    Assessment/Plan  patient is a 41 year old gentleman history of recent cocaine abuse and recent high intake of alcohol. Presents with atrial fibrillation with RVR currently resolved.   Present on Admission:   .Atrial fibrillation with RVR - will switch him to Cardizem by mouth for a start of 60 q. 8 and then increase if needed would need to ago which home dose of Cardizem was he taking. Will obtain echo gram of a  heart, cycle cardiac enzymes, obtain TSH. So far he is on a heparin drip as was started in the emergency department. If the patient is now willing to continue Coumadin this should be discontinued  .Acute renal failure likely secondary to dehydration will give aggressive IV fluids and monitor his creatinine started improving .Polysubstance abuse patient seemed to have relapsed he is not a recurrent cocaine abuser. He may benefit from social work consult to help prevent father relapse. Will watch for signs of alcohol withdrawal at this point he does not demonstrate any   Prophylaxis:  heparin  CODE STATUS:Full code  I have spent a total of 50 min on this admition  Sharena Dibenedetto 08/01/2011, 6:18 AM

## 2011-08-01 NOTE — Progress Notes (Signed)
Utilization review completed.  

## 2011-08-01 NOTE — ED Notes (Signed)
Report given to Reg RN on 2100. Waiting for carelink to call for transport.

## 2011-08-07 ENCOUNTER — Ambulatory Visit: Payer: 59 | Admitting: Cardiology

## 2011-09-12 ENCOUNTER — Inpatient Hospital Stay (HOSPITAL_BASED_OUTPATIENT_CLINIC_OR_DEPARTMENT_OTHER)
Admission: EM | Admit: 2011-09-12 | Discharge: 2011-09-15 | DRG: 641 | Disposition: A | Discharge status: Home / Self Care | Payer: 59 | Attending: Internal Medicine | Admitting: Internal Medicine

## 2011-09-12 ENCOUNTER — Encounter (HOSPITAL_BASED_OUTPATIENT_CLINIC_OR_DEPARTMENT_OTHER): Payer: Self-pay | Admitting: *Deleted

## 2011-09-12 ENCOUNTER — Emergency Department (HOSPITAL_BASED_OUTPATIENT_CLINIC_OR_DEPARTMENT_OTHER)
Admission: EM | Admit: 2011-09-12 | Discharge: 2011-09-12 | Disposition: A | Discharge status: Home / Self Care | Payer: 59

## 2011-09-12 DIAGNOSIS — I480 Paroxysmal atrial fibrillation: Secondary | ICD-10-CM | POA: Diagnosis present

## 2011-09-12 DIAGNOSIS — R109 Unspecified abdominal pain: Secondary | ICD-10-CM | POA: Diagnosis present

## 2011-09-12 DIAGNOSIS — K5289 Other specified noninfective gastroenteritis and colitis: Secondary | ICD-10-CM | POA: Diagnosis present

## 2011-09-12 DIAGNOSIS — I519 Heart disease, unspecified: Secondary | ICD-10-CM | POA: Diagnosis present

## 2011-09-12 DIAGNOSIS — I10 Essential (primary) hypertension: Secondary | ICD-10-CM | POA: Diagnosis present

## 2011-09-12 DIAGNOSIS — I5189 Other ill-defined heart diseases: Secondary | ICD-10-CM | POA: Diagnosis present

## 2011-09-12 DIAGNOSIS — F17200 Nicotine dependence, unspecified, uncomplicated: Secondary | ICD-10-CM | POA: Diagnosis present

## 2011-09-12 DIAGNOSIS — E860 Dehydration: Principal | ICD-10-CM | POA: Diagnosis present

## 2011-09-12 DIAGNOSIS — Z7982 Long term (current) use of aspirin: Secondary | ICD-10-CM

## 2011-09-12 DIAGNOSIS — F1910 Other psychoactive substance abuse, uncomplicated: Secondary | ICD-10-CM

## 2011-09-12 DIAGNOSIS — N179 Acute kidney failure, unspecified: Secondary | ICD-10-CM

## 2011-09-12 DIAGNOSIS — R9431 Abnormal electrocardiogram [ECG] [EKG]: Secondary | ICD-10-CM | POA: Diagnosis present

## 2011-09-12 DIAGNOSIS — M6282 Rhabdomyolysis: Secondary | ICD-10-CM | POA: Diagnosis present

## 2011-09-12 DIAGNOSIS — D72829 Elevated white blood cell count, unspecified: Secondary | ICD-10-CM | POA: Diagnosis present

## 2011-09-12 DIAGNOSIS — I4891 Unspecified atrial fibrillation: Secondary | ICD-10-CM | POA: Diagnosis present

## 2011-09-12 HISTORY — DX: Other psychoactive substance abuse, uncomplicated: F19.10

## 2011-09-12 HISTORY — DX: Paroxysmal atrial fibrillation: I48.0

## 2011-09-12 NOTE — ED Notes (Signed)
Pt. Reports he last ate last night.  Pt. Reports eating at [facility] and also had a hot dog yesterday.

## 2011-09-13 ENCOUNTER — Emergency Department (HOSPITAL_BASED_OUTPATIENT_CLINIC_OR_DEPARTMENT_OTHER): Payer: 59

## 2011-09-13 ENCOUNTER — Encounter (HOSPITAL_COMMUNITY): Payer: Self-pay | Admitting: *Deleted

## 2011-09-13 DIAGNOSIS — D72829 Elevated white blood cell count, unspecified: Secondary | ICD-10-CM | POA: Diagnosis present

## 2011-09-13 DIAGNOSIS — R109 Unspecified abdominal pain: Secondary | ICD-10-CM | POA: Diagnosis present

## 2011-09-13 DIAGNOSIS — E860 Dehydration: Principal | ICD-10-CM | POA: Diagnosis present

## 2011-09-13 DIAGNOSIS — M6282 Rhabdomyolysis: Secondary | ICD-10-CM

## 2011-09-13 DIAGNOSIS — R9431 Abnormal electrocardiogram [ECG] [EKG]: Secondary | ICD-10-CM | POA: Diagnosis present

## 2011-09-13 DIAGNOSIS — I10 Essential (primary) hypertension: Secondary | ICD-10-CM | POA: Diagnosis present

## 2011-09-13 DIAGNOSIS — I519 Heart disease, unspecified: Secondary | ICD-10-CM | POA: Diagnosis present

## 2011-09-13 DIAGNOSIS — R1084 Generalized abdominal pain: Secondary | ICD-10-CM

## 2011-09-13 DIAGNOSIS — I4891 Unspecified atrial fibrillation: Secondary | ICD-10-CM

## 2011-09-13 DIAGNOSIS — F1910 Other psychoactive substance abuse, uncomplicated: Secondary | ICD-10-CM

## 2011-09-13 DIAGNOSIS — I5189 Other ill-defined heart diseases: Secondary | ICD-10-CM | POA: Diagnosis present

## 2011-09-13 HISTORY — DX: Rhabdomyolysis: M62.82

## 2011-09-13 LAB — COMPREHENSIVE METABOLIC PANEL
Alkaline Phosphatase: 46 U/L (ref 39–117)
BUN: 10 mg/dL (ref 6–23)
Chloride: 103 mEq/L (ref 96–112)
GFR calc Af Amer: >90 mL/min (ref >90)
Glucose, Bld: 133 mg/dL — ABNORMAL HIGH (ref 70–99)
Potassium: 3.5 mEq/L (ref 3.5–5.1)
Total Bilirubin: 0.4 mg/dL (ref 0.3–1.2)

## 2011-09-13 LAB — TROPONIN I: Troponin I: <0.3 ng/mL (ref <0.30)

## 2011-09-13 LAB — CBC
Hemoglobin: 16.8 g/dL (ref 13.0–17.0)
MCH: 31.7 pg (ref 26.0–34.0)
MCH: 31.8 pg (ref 26.0–34.0)
MCHC: 35.5 g/dL (ref 30.0–36.0)
MCV: 89.4 fL (ref 78.0–100.0)
Platelets: 310 10*3/uL (ref 150–400)
RBC: 5.28 MIL/uL (ref 4.22–5.81)
RDW: 14.2 % (ref 11.5–15.5)

## 2011-09-13 LAB — BASIC METABOLIC PANEL
BUN: 11 mg/dL (ref 6–23)
CO2: 23 mEq/L (ref 19–32)
Chloride: 104 mEq/L (ref 96–112)
Creatinine, Ser: 1.24 mg/dL (ref 0.50–1.35)
Glucose, Bld: 87 mg/dL (ref 70–99)

## 2011-09-13 LAB — DIFFERENTIAL
Lymphs Abs: 2.3 10*3/uL (ref 0.7–4.0)
Monocytes Relative: 6 % (ref 3–12)
Neutro Abs: 10 10*3/uL — ABNORMAL HIGH (ref 1.7–7.7)
Neutrophils Relative %: 75 % (ref 43–77)

## 2011-09-13 LAB — CARDIAC PANEL(CRET KIN+CKTOT+MB+TROPI)
CK, MB: 6.7 ng/mL (ref 0.3–4.0)
Relative Index: 0.5 (ref 0.0–2.5)
Total CK: 1571 U/L — ABNORMAL HIGH (ref 7–232)
Troponin I: <0.3 ng/mL (ref <0.30)
Troponin I: <0.3 ng/mL (ref <0.30)
Troponin I: <0.3 ng/mL (ref <0.30)

## 2011-09-13 LAB — LIPASE, BLOOD: Lipase: 34 U/L (ref 11–59)

## 2011-09-13 LAB — PHOSPHORUS: Phosphorus: 3.3 mg/dL (ref 2.3–4.6)

## 2011-09-13 MED ORDER — ALUM & MAG HYDROXIDE-SIMETH 200-200-20 MG/5ML PO SUSP
30.0000 mL | Freq: Once | ORAL | Status: AC
  Administered 2011-09-13: 30 mL via ORAL
  Filled 2011-09-13: qty 30

## 2011-09-13 MED ORDER — HYDROCODONE-ACETAMINOPHEN 5-325 MG PO TABS: 1.0000 | ORAL_TABLET | ORAL | Status: DC | PRN

## 2011-09-13 MED ORDER — HYDROCODONE-ACETAMINOPHEN 5-325 MG PO TABS: 1.0000 | ORAL_TABLET | Freq: Four times a day (QID) | ORAL | Status: DC | PRN

## 2011-09-13 MED ORDER — DILTIAZEM HCL ER COATED BEADS 300 MG PO CP24
300.0000 mg | ORAL_CAPSULE | Freq: Every day | ORAL | Status: DC
  Administered 2011-09-13 – 2011-09-15 (×3): 300 mg via ORAL
  Filled 2011-09-13 (×3): qty 1

## 2011-09-13 MED ORDER — FOLIC ACID 1 MG PO TABS
1.0000 mg | ORAL_TABLET | Freq: Every day | ORAL | Status: DC
  Administered 2011-09-13 – 2011-09-15 (×3): 1 mg via ORAL
  Filled 2011-09-13 (×3): qty 1

## 2011-09-13 MED ORDER — ASPIRIN 81 MG PO CHEW
324.0000 mg | CHEWABLE_TABLET | Freq: Once | ORAL | Status: AC
  Administered 2011-09-13: 324 mg via ORAL
  Filled 2011-09-13: qty 4

## 2011-09-13 MED ORDER — ACETAMINOPHEN 650 MG RE SUPP: 650.0000 mg | Freq: Four times a day (QID) | RECTAL | Status: DC | PRN

## 2011-09-13 MED ORDER — SODIUM CHLORIDE 0.45 % IV SOLN
INTRAVENOUS | Status: DC
  Administered 2011-09-13: 125 mL/h via INTRAVENOUS

## 2011-09-13 MED ORDER — SODIUM CHLORIDE 0.9 % IV BOLUS (SEPSIS)
500.0000 mL | Freq: Once | INTRAVENOUS | Status: AC
  Administered 2011-09-13: 1000 mL via INTRAVENOUS

## 2011-09-13 MED ORDER — ONDANSETRON HCL 4 MG/2ML IJ SOLN: 4.0000 mg | Freq: Four times a day (QID) | INTRAMUSCULAR | Status: DC | PRN

## 2011-09-13 MED ORDER — PANTOPRAZOLE SODIUM 40 MG IV SOLR
40.0000 mg | INTRAVENOUS | Status: DC
  Administered 2011-09-13: 40 mg via INTRAVENOUS
  Filled 2011-09-13 (×2): qty 40

## 2011-09-13 MED ORDER — DILTIAZEM HCL 100 MG IV SOLR
5.0000 mg/h | INTRAVENOUS | Status: AC
  Administered 2011-09-13: 5 mg/h via INTRAVENOUS

## 2011-09-13 MED ORDER — ADULT MULTIVITAMIN W/MINERALS CH
1.0000 | ORAL_TABLET | Freq: Every day | ORAL | Status: DC
  Administered 2011-09-13 – 2011-09-15 (×3): 1 via ORAL
  Filled 2011-09-13 (×3): qty 1

## 2011-09-13 MED ORDER — DILTIAZEM HCL 50 MG/10ML IV SOLN
10.0000 mg | Freq: Once | INTRAVENOUS | Status: DC
  Filled 2011-09-13: qty 2

## 2011-09-13 MED ORDER — ACETAMINOPHEN 325 MG PO TABS: 650.0000 mg | ORAL_TABLET | Freq: Four times a day (QID) | ORAL | Status: DC | PRN

## 2011-09-13 MED ORDER — VITAMIN B-12 100 MCG PO TABS
100.0000 ug | ORAL_TABLET | Freq: Every day | ORAL | Status: DC
  Administered 2011-09-13 – 2011-09-15 (×3): 100 ug via ORAL
  Filled 2011-09-13 (×4): qty 1

## 2011-09-13 MED ORDER — FLUTICASONE PROPIONATE 50 MCG/ACT NA SUSP
1.0000 | Freq: Every day | NASAL | Status: DC
  Administered 2011-09-13 – 2011-09-14 (×2): 1 via NASAL
  Filled 2011-09-13: qty 16

## 2011-09-13 MED ORDER — ASPIRIN EC 81 MG PO TBEC
81.0000 mg | DELAYED_RELEASE_TABLET | Freq: Every day | ORAL | Status: DC
  Administered 2011-09-13 – 2011-09-15 (×3): 81 mg via ORAL
  Filled 2011-09-13 (×3): qty 1

## 2011-09-13 MED ORDER — SODIUM CHLORIDE 0.45 % IV SOLN: INTRAVENOUS | Status: DC

## 2011-09-13 MED ORDER — ONDANSETRON HCL 4 MG PO TABS: 4.0000 mg | ORAL_TABLET | Freq: Four times a day (QID) | ORAL | Status: DC | PRN

## 2011-09-13 MED ORDER — PANTOPRAZOLE SODIUM 40 MG PO TBEC: 40.0000 mg | DELAYED_RELEASE_TABLET | Freq: Every day | ORAL | Status: DC

## 2011-09-13 MED ORDER — ONDANSETRON HCL 4 MG/2ML IJ SOLN
4.0000 mg | Freq: Once | INTRAMUSCULAR | Status: AC
  Administered 2011-09-13: 4 mg via INTRAVENOUS
  Filled 2011-09-13 (×2): qty 2

## 2011-09-13 MED ORDER — SODIUM CHLORIDE 0.9 % IV BOLUS (SEPSIS)
500.0000 mL | Freq: Once | INTRAVENOUS | Status: AC
  Administered 2011-09-13: 500 mL via INTRAVENOUS

## 2011-09-13 MED ORDER — LOSARTAN POTASSIUM 25 MG PO TABS
25.0000 mg | ORAL_TABLET | Freq: Every day | ORAL | Status: DC
  Administered 2011-09-13 – 2011-09-15 (×3): 25 mg via ORAL
  Filled 2011-09-13 (×3): qty 1

## 2011-09-13 MED ORDER — METOPROLOL TARTRATE 1 MG/ML IV SOLN
INTRAVENOUS | Status: AC
  Filled 2011-09-13: qty 5

## 2011-09-13 MED ORDER — DILTIAZEM HCL 100 MG IV SOLR
5.0000 mg/h | INTRAVENOUS | Status: DC
  Filled 2011-09-13: qty 100

## 2011-09-13 MED ORDER — METOPROLOL TARTRATE 12.5 MG HALF TABLET
12.5000 mg | ORAL_TABLET | Freq: Two times a day (BID) | ORAL | Status: DC
  Administered 2011-09-13 – 2011-09-14 (×3): 12.5 mg via ORAL
  Filled 2011-09-13 (×4): qty 1

## 2011-09-13 MED ORDER — METOPROLOL TARTRATE 1 MG/ML IV SOLN
5.0000 mg | Freq: Once | INTRAVENOUS | Status: AC
  Administered 2011-09-13: 5 mg via INTRAVENOUS

## 2011-09-13 MED ORDER — PANTOPRAZOLE SODIUM 40 MG PO TBEC
40.0000 mg | DELAYED_RELEASE_TABLET | Freq: Every day | ORAL | Status: DC
  Administered 2011-09-14: 40 mg via ORAL
  Filled 2011-09-13 (×2): qty 1

## 2011-09-13 MED ORDER — MORPHINE SULFATE 2 MG/ML IJ SOLN: 2.0000 mg | INTRAMUSCULAR | Status: DC | PRN

## 2011-09-13 MED ORDER — BUPROPION HCL ER (XL) 150 MG PO TB24
150.0000 mg | ORAL_TABLET | Freq: Every day | ORAL | Status: DC
  Administered 2011-09-13 – 2011-09-15 (×3): 150 mg via ORAL
  Filled 2011-09-13 (×3): qty 1

## 2011-09-13 MED ORDER — DILTIAZEM HCL 100 MG IV SOLR
INTRAVENOUS | Status: AC
  Filled 2011-09-13: qty 100

## 2011-09-13 MED ORDER — SODIUM CHLORIDE 0.9 % IJ SOLN: 3.0000 mL | Freq: Two times a day (BID) | INTRAMUSCULAR | Status: DC

## 2011-09-13 MED ORDER — SODIUM CHLORIDE 0.9 % IV SOLN
INTRAVENOUS | Status: DC
  Administered 2011-09-13: 100 mL/h via INTRAVENOUS
  Administered 2011-09-14 – 2011-09-15 (×2): via INTRAVENOUS

## 2011-09-13 MED ORDER — ENOXAPARIN SODIUM 40 MG/0.4ML ~~LOC~~ SOLN
40.0000 mg | SUBCUTANEOUS | Status: DC
Start: 2011-09-13 — End: 2011-09-15
  Administered 2011-09-13 – 2011-09-14 (×2): 40 mg via SUBCUTANEOUS
  Filled 2011-09-13 (×3): qty 0.4

## 2011-09-13 NOTE — Care Management Note (Signed)
    Page 1 of 1   09/13/2011     9:32:47 AM   CARE MANAGEMENT NOTE 09/13/2011  Patient:  Raymond Short, Raymond Short   Account Number:  [id number]  Date Initiated:  09/13/2011  Documentation initiated by:  Junius Creamer  Subjective/Objective Assessment:   adm w at fib w rvr     Action/Plan:   lives w wife   Anticipated DC Date:  09/16/2011   Anticipated DC Plan:  HOME/SELF CARE      DC Planning Services  CM consult      Choice offered to / List presented to:             Status of service:   Medicare Important Message given?   (If response is "NO", the following Medicare IM given date fields will be blank) Date Medicare IM given:   Date Additional Medicare IM given:    Discharge Disposition:  HOME/SELF CARE  Per UR Regulation:  Reviewed for med. necessity/level of care/duration of stay  If discussed at Long Length of Stay Meetings, dates discussed:    Comments:  5/24 9:31a debbie Zakir Henner rn,bsn [phone]

## 2011-09-13 NOTE — ED Notes (Signed)
Pt. To get bolus of 500mg  and aspirin 324mg  oral.  Pt. To have a systolic B/P of 120 before starting the cardizem.  Give Dr. Nicanor Alcon reading of B/P before starting Pt. On Cardizem.

## 2011-09-13 NOTE — Consult Note (Signed)
[facility]  CARDIOLOGY CONSULTATION NOTE.  NAME:  Raymond Short   MRN: [id number] DOB:  Aug 23, 1970   ADMIT DATE: 09/12/2011  Reason for Consult: Atrial Fibrillation with RVR  Requesting Physician: Dr. Earlie Lou (TRH_  Primary Cardiologist: Hilty, Kenneth Chandler ([place]), MD  HPI: This is a 41 y.o. male with a past medical history significant for recurrent paroxysmal Afib, PSA & HTN who last presented with Afib RVR in April of this year - spontaneously converted to NSR & d/c'd on increased Diltiazem dose + ASA.  The episode was thought to be related to binge drinking & cocaine use.  He claims that he has had not further cocaine use since then and only had a few beers -- including only 1 beer with dinner the night before last. He says that on the 22nd, he ate a hotdog & fries from a [employer] grille for lunch & [employer] for dinner.  Was otherwise in his USOH until yesterday afternoon when he developed severe abdominal (peri-umbilical) cramping pain.  He felt as though he needed to have a BM, but when he went to the bathroom, he actually vomited.  The symptom abated after several hours & he had no diarrhea.  When he went to ER and was found to be in Afib with RVR -- he did not notice any palpitations, chest discomfort or SOB -- his usual Afib related symptoms.  Just felt "bad".   He did mention having some pain in his left arm.    Pertinent negatives - chest pain, chest pressure/discomfort, claudication, dyspnea, exertional chest pressure/discomfort, fatigue, irregular heart beat, lower extremity edema, near-syncope, orthopnea, palpitations, paroxysmal nocturnal dyspnea, syncope and tachypnea.  PMHx:  CARDIAC HISTORY: Echo:  07/2011: Normal contractility, moderate Concentric LVH with Grade 2 diastolic dysfunction and mildly dilated Left Atrium.   Cath:  n/a  NST:  Per his report - had NST done at time of initial Dx of Afib as OP (~2011).   Non-ischemic  Past Medical History  Diagnosis Date  . Paroxysmal atrial fibrillation   . Hypertension   . Polysubstance abuse     History of EtOH, Cocaine   History reviewed. No pertinent past surgical history.  FAMHx: History reviewed. No pertinent family history.  SOCHx:  reports that he has been smoking Cigarettes.  He has a 24 pack-year smoking history. He does not have any smokeless tobacco history on file. He reports that he drinks about 1.8 ounces of alcohol per week. He reports that he uses illicit drugs (Cocaine). -- Says that he has not used cocaine since last hospitalization.  Only had 1 beer yesterday.  ALLERGIES: No Known Allergies  ROS: A comprehensive review of systems was negative except for: Gastrointestinal: positive for abdominal pain, vomiting and not related to recent meal. Nausea Musculoskeletal: positive for Intermittent Left Arm pain.  HOME MEDICATIONS: Per clinic note - Diltiazem was increased to 300mg  daily Prescriptions prior to admission  Medication Sig Dispense Refill  . aspirin EC 81 MG EC tablet Take 1 tablet (81 mg total) by mouth daily.      Marland Kitchen buPROPion (WELLBUTRIN XL) 150 MG 24 hr tablet Take 150 mg by mouth daily.      Marland Kitchen diltiazem (DILACOR XR) 240 MG 24 hr capsule Take 240 mg by mouth daily.      . fluticasone (FLONASE) 50 MCG/ACT nasal spray Place 1 spray into the nose daily.      . folic acid (FOLVITE) 1 MG  tablet Take 1 tablet (1 mg total) by mouth daily.  30 tablet  5  . losartan (COZAAR) 50 MG tablet Take 50 mg by mouth daily.      . Multiple Vitamin (MULITIVITAMIN WITH MINERALS) TABS Take 1 tablet by mouth daily.      . Omega-3-acid Ethyl Esters (LOVAZA PO) Take 1 capsule by mouth daily.      . pantoprazole (PROTONIX) 40 MG tablet Take 40 mg by mouth daily as needed. For acid reflux      . vitamin B-12 100 MCG tablet Take 1 tablet (100 mcg total) by mouth daily.  30 tablet  5  . DISCONTD: BuPROPion HCl (WELLBUTRIN XL PO) Take 1 tablet by  mouth daily.      Marland Kitchen DISCONTD: Diltiazem HCl Coated Beads (DILTIAZEM HCL CD PO) Take 1 tablet by mouth daily.      Marland Kitchen DISCONTD: LOSARTAN POTASSIUM PO Take 1 tablet by mouth daily.        HOSPITAL MEDICATIONS:    . aspirin  324 mg Oral Once  . aspirin EC  81 mg Oral Daily  . buPROPion  150 mg Oral Daily  . diltiazem  300 mg Oral Daily  . diltiazem      . enoxaparin  40 mg Subcutaneous Q24H  . fluticasone  1 spray Each Nare Daily  . folic acid  1 mg Oral Daily  . losartan  25 mg Oral Daily  . mulitivitamin with minerals  1 tablet Oral Daily  . ondansetron (ZOFRAN) IV  4 mg Intravenous Once  . pantoprazole  40 mg Oral Q1200  . pantoprazole (PROTONIX) IV  40 mg Intravenous Q24H  . sodium chloride  500 mL Intravenous Once  . sodium chloride  3 mL Intravenous Q12H  . vitamin B-12  100 mcg Oral Daily  . DISCONTD: diltiazem  10 mg Intravenous Once    VITALS: Blood pressure 85/62, pulse 73, temperature 98 F (36.7 C), temperature source Oral, resp. rate 12, height 5\' 11"  (1.803 m), weight 102.967 kg (227 lb), SpO2 96.00%.  PHYSICAL EXAM: General appearance: alert, cooperative, appears stated age, no distress, mildly obese and normal mood & affect Neck: no adenopathy, no carotid bruit, no JVD, supple, symmetrical, trachea midline and thyroid not enlarged, symmetric, no tenderness/mass/nodules Lungs: clear to auscultation bilaterally, normal percussion bilaterally and non-lobored Heart: irregularly irregular rhythm, S1, S2 normal, no S3 or S4, no click, no rub and no murmur Abdomen: soft, non-tender; bowel sounds normal; no masses,  no organomegaly and no rebound tenderness. Extremities: extremities normal, atraumatic, no cyanosis or edema Pulses: 2+ and symmetric Skin: Skin color, texture, turgor normal. No rashes or lesions Neurologic: Alert and oriented X 3, normal strength and tone. Normal symmetric reflexes. Normal coordination and gait  LABS: Results for orders placed during the  hospital encounter of 09/12/11 (from the past 48 hour(s))  CBC     Status: Abnormal   Collection Time   09/13/11 12:16 AM      Component Value Range Comment   WBC 13.3 (*) 4.0 - 10.5 (K/uL)    RBC 5.28  4.22 - 5.81 (MIL/uL)    Hemoglobin 16.8  13.0 - 17.0 (g/dL)    HCT 08.6  57.8 - 46.9 (%)    MCV 87.7  78.0 - 100.0 (fL)    MCH 31.8  26.0 - 34.0 (pg)    MCHC 36.3 (*) 30.0 - 36.0 (g/dL)    RDW 62.9  52.8 - 41.3 (%)    Platelets  329  150 - 400 (K/uL)   DIFFERENTIAL     Status: Abnormal   Collection Time   09/13/11 12:16 AM      Component Value Range Comment   Neutrophils Relative 75  43 - 77 (%)    Neutro Abs 10.0 (*) 1.7 - 7.7 (K/uL)    Lymphocytes Relative 18  12 - 46 (%)    Lymphs Abs 2.3  0.7 - 4.0 (K/uL)    Monocytes Relative 6  3 - 12 (%)    Monocytes Absolute 0.8  0.1 - 1.0 (K/uL)    Eosinophils Relative 1  0 - 5 (%)    Eosinophils Absolute 0.2  0.0 - 0.7 (K/uL)    Basophils Relative 0  0 - 1 (%)    Basophils Absolute 0.0  0.0 - 0.1 (K/uL)   COMPREHENSIVE METABOLIC PANEL     Status: Abnormal   Collection Time   09/13/11 12:16 AM      Component Value Range Comment   Sodium 140  135 - 145 (mEq/L)    Potassium 3.5  3.5 - 5.1 (mEq/L)    Chloride 103  96 - 112 (mEq/L)    CO2 23  19 - 32 (mEq/L)    Glucose, Bld 133 (*) 70 - 99 (mg/dL)    BUN 10  6 - 23 (mg/dL)    Creatinine, Ser 1.61  0.50 - 1.35 (mg/dL)    Calcium 09.6  8.4 - 10.5 (mg/dL)    Total Protein 7.9  6.0 - 8.3 (g/dL)    Albumin 4.6  3.5 - 5.2 (g/dL)    AST 25  0 - 37 (U/L)    ALT 18  0 - 53 (U/L)    Alkaline Phosphatase 46  39 - 117 (U/L)    Total Bilirubin 0.4  0.3 - 1.2 (mg/dL)    GFR calc non Af Amer 81 (*) >90 (mL/min)    GFR calc Af Amer >90  >90 (mL/min)   LIPASE, BLOOD     Status: Normal   Collection Time   09/13/11 12:16 AM      Component Value Range Comment   Lipase 34  11 - 59 (U/L)   TROPONIN I     Status: Normal   Collection Time   09/13/11  2:17 AM      Component Value Range Comment    Troponin I <0.30  <0.30 (ng/mL)   MRSA PCR SCREENING     Status: Normal   Collection Time   09/13/11  4:38 AM      Component Value Range Comment   MRSA by PCR NEGATIVE  NEGATIVE    CBC     Status: Abnormal   Collection Time   09/13/11  5:50 AM      Component Value Range Comment   WBC 12.6 (*) 4.0 - 10.5 (K/uL)    RBC 4.92  4.22 - 5.81 (MIL/uL)    Hemoglobin 15.6  13.0 - 17.0 (g/dL)    HCT 04.5  40.9 - 81.1 (%)    MCV 89.4  78.0 - 100.0 (fL)    MCH 31.7  26.0 - 34.0 (pg)    MCHC 35.5  30.0 - 36.0 (g/dL)    RDW 91.4  78.2 - 95.6 (%)    Platelets 310  150 - 400 (K/uL)   CREATININE, SERUM     Status: Abnormal   Collection Time   09/13/11  5:50 AM      Component Value Range Comment  Creatinine, Ser 1.03  0.50 - 1.35 (mg/dL)    GFR calc non Af Amer 87 (*) >90 (mL/min)    GFR calc Af Amer >90  >90 (mL/min)   CARDIAC PANEL(CRET KIN+CKTOT+MB+TROPI)     Status: Abnormal   Collection Time   09/13/11  5:50 AM      Component Value Range Comment   Total CK 1483 (*) 7 - 232 (U/L)    CK, MB 7.7 (*) 0.3 - 4.0 (ng/mL)    Troponin I <0.30  <0.30 (ng/mL)    Relative Index 0.5  0.0 - 2.5      IMAGING: Personally reviewed. Dg Chest Portable 1 View  09/13/2011  *RADIOLOGY REPORT*  Clinical Data: Epigastric abdominal pain.  Nausea and vomiting. Smoker with history of hypertension.  PORTABLE CHEST - 1 VIEW 09/13/2011 0055 hours:  Comparison: Portable chest x-ray 07/31/2011 and two-view chest x- ray 02/13/1010.  Findings: Cardiac silhouette mildly enlarged but stable.  Thoracic aorta mildly tortuous, unchanged.  Hilar and mediastinal contours otherwise unremarkable.  Mild pulmonary venous hypertension without overt edema, unchanged.  Lungs clear.  IMPRESSION: Stable mild cardiomegaly.  No acute cardiopulmonary disease.  Original Report Authenticated By: Arnell Sieving, M.D.    IMPRESSION: Principal Problem:  *Abdominal pain - with Nausea & emesis Active Problems:  Atrial fibrillation with RVR   HTN (hypertension)  Abnormal EKG  Leukocytosis  Recurrence of Afib in the setting of what sounds like a bout of Gastroenteritis.  The GI episode was to brief to consider Pancreatitis (as would the normal amylase/lipase) & normal LFTs would argue against gall-stones.  He was concerned about possible "food poisoning" or "something he ate did not agree with him", the most obvious potential culprits (in order) would be the [employer] hotdog & fries followed by [employer].  Although possible, he did have leukocytosis, suggestive of inflammation such as gastroenteritis.  The GI symptoms seem to have resolved & he is feeling his appetite returning.  Interestingly, he did not notice being in atrial fibrillation, he suggested that th GI upset was intense enough that he probably woud not have noticed it.  He is comfortable now & did not seem affected by his mild hypotension overnight.  I suspect he is somewhat dehydrated making him more sensitive to the Diltiazem gtt.  As his "cardiac symptoms" were almost non-existent, I have a low suspicion that this is ACS related and his biomarkers are negative so far.  RECOMMENDATION:  Continue with Diltiazem gtt for now with plan to convert to po either later today or tomorrow.  CHADS2 score is ~1, so agree with Dr. Rennis Golden -- ASA only  Gently hydrate & advance diet.  Would defer additional GI workup to [facility] service.  I do not feel that his primary issue is cardiac.  We will continue to consult, but would anticipate d/c soon.  Time Spent Directly with Patient: 30 minutes - with chart - 20  Audie Stayer W, M.D., M.S. [facility] [address] [place], [place]  [address]  [phone]  09/13/2011 8:59 AM

## 2011-09-13 NOTE — ED Notes (Signed)
Report called to [facility] on [address] Bed 2902-1.Care link on unit to transport pt.

## 2011-09-13 NOTE — Progress Notes (Signed)
[facility]   Subjective: Alert. Endorses had gastrointestinal symptoms about 24 hours after ingesting hot dog from an [employer] service station although states that prior to developing the pain and emesis patient states he had anorexia and just did not feel right. At the present time abdominal symptoms have resolved and patient is tolerating solid diet. He denies chest pain or shortness of breath.  Objective: Vital signs in last 24 hours: Temp:  [97.4 F (36.3 C)-98.5 F (36.9 C)] 98.5 F (36.9 C) (05/24 1210) Pulse Rate:  [49-122] 111  (05/24 1200) Resp:  [12-24] 12  (05/24 1200) BP: (85-145)/(62-96) 127/66 mmHg (05/24 1200) SpO2:  [93 %-100 %] 96 % (05/24 1200) Weight:  [102.967 kg (227 lb)] 102.967 kg (227 lb) (05/24 0436) Weight change:  Last BM Date: 09/12/11  Intake/Output from previous day: 05/23 0701 - 05/24 0700 In: 247.1 [I.V.:247.1] Out: 100 [Urine:100] Intake/Output this shift: Total I/O In: 1010 [P.O.:240; I.V.:270; IV Piggyback:500] Out: -   General appearance: alert, cooperative, appears stated age and no distress Resp: clear to auscultation bilaterally Cardio: Irregular rate and a true fibrillation rhythm, S1, S2 normal, no murmur, click, rub or gallop; IV Cardizem infusion at 5 cc per hour GI: soft, non-tender; bowel sounds normal; no masses,  no organomegaly Extremities: extremities normal, atraumatic, no cyanosis or edema Neurologic: Grossly normal  Lab Results:  Basename 09/13/11 0550 09/13/11 0016  WBC 12.6* 13.3*  HGB 15.6 16.8  HCT 44.0 46.3  PLT 310 329   BMET  Basename 09/13/11 0550 09/13/11 0016  NA -- 140  K -- 3.5  CL -- 103  CO2 -- 23  GLUCOSE -- 133*  BUN -- 10  CREATININE 1.03 1.10  CALCIUM -- 10.1    Studies/Results: Dg Chest Portable 1 View  09/13/2011  *RADIOLOGY REPORT*  Clinical Data: Epigastric abdominal pain.  Nausea and vomiting. Smoker with history of hypertension.  PORTABLE CHEST - 1 VIEW 09/13/2011 0055 hours:   Comparison: Portable chest x-ray 07/31/2011 and two-view chest x- ray 02/13/1010.  Findings: Cardiac silhouette mildly enlarged but stable.  Thoracic aorta mildly tortuous, unchanged.  Hilar and mediastinal contours otherwise unremarkable.  Mild pulmonary venous hypertension without overt edema, unchanged.  Lungs clear.  IMPRESSION: Stable mild cardiomegaly.  No acute cardiopulmonary disease.  Original Report Authenticated By: Arnell Sieving, M.D.    Medications: I have reviewed the patient's current medications.  Assessment/Plan:  Principal Problem:  *Abdominal pain - with Nausea & emesis *Likely related to acute gastroenteritis which is resolving *Tolerating solid diet at the present time  Active Problems:  Atrial fibrillation with RVR *History of paroxysmal atrial fibrillation *Currently maintaining atrial fibrillation with minimal tachycardia *Suspect related to volume depletion from GI illness *Continue to restore volume *Have given oral Cardizem with plans to titrate and discontinue IV Cardizem   Leukocytosis/Dehydration/ Rhabdomyolysis *CPK 1500 and has increased slightly to 1600 since admission *Continue IV fluid hydration at home and 25 cc an hour *Followup labs in the morning   Abnormal EKG/PVCs *Cardiac enzymes negative x2 collections guarding abnormal *Having frequent multifocal PVCs and occasional runs of nonsustained ventricular tachycardia *Check stat be met with phosphorus and magnesium   HTN (hypertension)/ Left ventricular diastolic dysfunction, NYHA class 2 *Continue usual home medications *Watch closely for volume overload with IV fluid hydration   Disposition *Remain in step down    LOS: 1 day   Junious Silk, ANP pager [phone]  [facility] [email or web address] Password: [email or web address]  09/13/2011, 1:32 PM  I have  examined the patient and reviewed the chart. I agree with the above note.   Calvert Cantor, MD [phone]

## 2011-09-13 NOTE — Consult Note (Signed)
Pt is a 1 ppd smoker and is currently in contemplation stage. He plans to quit but not sure exactly when. Referred to 1-800 quit now for f/u and support. Discussed oral fixation substitutes, second hand smoke and in home smoking policy. Reviewed and gave pt Written education/contact information.

## 2011-09-13 NOTE — ED Provider Notes (Signed)
History     CSN: [id number]  Arrival date & time 09/12/11  2338   First MD Initiated Contact with Patient 09/13/11 0014      Chief Complaint  Patient presents with  . Abdominal Pain    started at 5pm today after drinking a glass milk.  Pt. reports he was vomiting before the glass of milk.    (Consider location/radiation/quality/duration/timing/severity/associated sxs/prior treatment) Patient is a 41 y.o. male presenting with abdominal pain. The history is provided by the patient. No language interpreter was used.  Abdominal Pain The primary symptoms of the illness include abdominal pain, nausea and vomiting. The primary symptoms of the illness do not include shortness of breath. The current episode started 6 to 12 hours ago. The onset of the illness was sudden. Progression since onset: episodic.  Associated with: nothing. The patient has not had a change in bowel habit. Risk factors: none. Additional symptoms associated with the illness include diaphoresis. Symptoms associated with the illness do not include heartburn, constipation, frequency or back pain. Associated symptoms comments: Radiation to the left arm . Significant associated medical issues include cardiac disease.  Had exerted himself this afternoon mopping and then was cooking and had epigastric pain with associated left UE pain and diaphoresis and n/v.  Pain is intermittent and whenever it comes on the pain in the arm worsens.  No f/c/r.  No melena no hematemesis reports seen by PMD with normal labs and exam for physical  Past Medical History  Diagnosis Date  . Atrial fibrillation, chronic   . Hypertension   . Atrial fibrillation     No past surgical history on file.  No family history on file.  History  Substance Use Topics  . Smoking status: Current Everyday Smoker  . Smokeless tobacco: Not on file  . Alcohol Use: Yes     pt has hx of alcohol abuse      Review of Systems  Constitutional: Positive for  diaphoresis.  Respiratory: Negative for shortness of breath.   Gastrointestinal: Positive for nausea, vomiting and abdominal pain. Negative for heartburn and constipation.  Genitourinary: Negative for frequency.  Musculoskeletal: Negative for back pain.  All other systems reviewed and are negative.    Allergies  Review of patient's allergies indicates no known allergies.  Home Medications   Current Outpatient Rx  Name Route Sig Dispense Refill  . ASPIRIN 81 MG PO TBEC Oral Take 1 tablet (81 mg total) by mouth daily.    Brigitte Pulse XL PO Oral Take 1 tablet by mouth daily.    Marland Kitchen DILTIAZEM HCL CD PO Oral Take 1 tablet by mouth daily.    Marland Kitchen FLUTICASONE PROPIONATE 50 MCG/ACT NA SUSP Nasal Place 1 spray into the nose daily.    Marland Kitchen FOLIC ACID 1 MG PO TABS Oral Take 1 tablet (1 mg total) by mouth daily. 30 tablet 5  . LOSARTAN POTASSIUM PO Oral Take 1 tablet by mouth daily.    . ADULT MULTIVITAMIN W/MINERALS CH Oral Take 1 tablet by mouth daily.    Marland Kitchen LOVAZA PO Oral Take 1 capsule by mouth daily.    Marland Kitchen PANTOPRAZOLE SODIUM 40 MG PO TBEC Oral Take 1 tablet (40 mg total) by mouth daily at 12 noon. 30 tablet 5  . CYANOCOBALAMIN 100 MCG PO TABS Oral Take 1 tablet (100 mcg total) by mouth daily. 30 tablet 5    BP 145/96  Pulse 87  Temp(Src) 97.4 F (36.3 C) (Oral)  Resp 14  SpO2 94%  Physical Exam  Constitutional: He is oriented to person, place, and time. He appears well-developed and well-nourished.  HENT:  Head: Normocephalic and atraumatic.  Mouth/Throat: Oropharynx is clear and moist.  Eyes: Conjunctivae are normal. Pupils are equal, round, and reactive to light.  Neck: Normal range of motion. Neck supple.  Cardiovascular: An irregularly irregular rhythm present. Tachycardia present.   Pulmonary/Chest: Effort normal and breath sounds normal. He has no wheezes. He has no rales.  Abdominal: Soft. Bowel sounds are normal. He exhibits no distension. There is no tenderness. There is no  rebound and no guarding.  Musculoskeletal: Normal range of motion.  Neurological: He is alert and oriented to person, place, and time. He has normal reflexes.  Skin: Skin is warm. He is diaphoretic.  Psychiatric: He has a normal mood and affect.    ED Course  Procedures (including critical care time)  Labs Reviewed  CBC - Abnormal; Notable for the following:    WBC 13.3 (*)    MCHC 36.3 (*)    All other components within normal limits  DIFFERENTIAL - Abnormal; Notable for the following:    Neutro Abs 10.0 (*)    All other components within normal limits  COMPREHENSIVE METABOLIC PANEL - Abnormal; Notable for the following:    Glucose, Bld 133 (*)    GFR calc non Af Amer 81 (*)    All other components within normal limits  LIPASE, BLOOD  TROPONIN I   Dg Chest Portable 1 View  09/13/2011  *RADIOLOGY REPORT*  Clinical Data: Epigastric abdominal pain.  Nausea and vomiting. Smoker with history of hypertension.  PORTABLE CHEST - 1 VIEW 09/13/2011 0055 hours:  Comparison: Portable chest x-ray 07/31/2011 and two-view chest x- ray 02/13/1010.  Findings: Cardiac silhouette mildly enlarged but stable.  Thoracic aorta mildly tortuous, unchanged.  Hilar and mediastinal contours otherwise unremarkable.  Mild pulmonary venous hypertension without overt edema, unchanged.  Lungs clear.  IMPRESSION: Stable mild cardiomegaly.  No acute cardiopulmonary disease.  Original Report Authenticated By: Arnell Sieving, M.D.     1. Atrial fibrillation with RVR   2. Abnormal EKG       MDM   Date: 09/13/2011  Rate: m: atrial fibrillation  QRS Axis: normal  Intervals: normal  ST/T Wave abnormalities: ST depressions inferiorly  Conduction Disutrbances:none  Narrative Interpretation:   Old EKG Reviewed: changes noted       Pain concerning for unstable angina given epigastric with n/v/d and arm pain.D/w Dr. Leron Croak of [facility] admit to medicine  Kaylenn Civil K Idalie Canto-Rasch, MD 09/13/11 [phone]

## 2011-09-13 NOTE — H&P (Signed)
Raymond Short is an 41 y.o. male.   Chief Complaint: Abdominal pain HPI: A 41 year old gentleman with history of atrial fibrillation who presented with onset of abdominal pain today. Pain was centrally located rated as 7/10. Associated with nausea and vomiting but no diarrhea. He has been more less constipated. Symptoms started after he ate outside at a restaurant yesterday. He woke up with the symptoms today and believe it was food poisoning. He then started having some palpitations associated with the symptoms. When seen at the emergency room at [place], he was found to be in atrial fibrillation with rapid ventricular response. Cardiology was consulted because of the thought that this could be angina equivalent. Patient is not to use cocaine in addition to alcohol and tobacco. He however denied using any substance this week. His pain is now gone. He has not vomited in a couple of hours and no nausea.  Past Medical History  Diagnosis Date  . Atrial fibrillation, chronic   . Hypertension   . Atrial fibrillation     History reviewed. No pertinent past surgical history.  History reviewed. No pertinent family history. Social History:  reports that he has been smoking Cigarettes.  He has a 24 pack-year smoking history. He does not have any smokeless tobacco history on file. He reports that he drinks about 1.8 ounces of alcohol per week. He reports that he uses illicit drugs (Cocaine).  Allergies: No Known Allergies  Medications Prior to Admission  Medication Sig Dispense Refill  . aspirin EC 81 MG EC tablet Take 1 tablet (81 mg total) by mouth daily.      . BuPROPion HCl (WELLBUTRIN XL PO) Take 1 tablet by mouth daily.      . Diltiazem HCl Coated Beads (DILTIAZEM HCL CD PO) Take 1 tablet by mouth daily.      . fluticasone (FLONASE) 50 MCG/ACT nasal spray Place 1 spray into the nose daily.      . folic acid (FOLVITE) 1 MG tablet Take 1 tablet (1 mg total) by mouth daily.  30 tablet  5  .  LOSARTAN POTASSIUM PO Take 1 tablet by mouth daily.      . Multiple Vitamin (MULITIVITAMIN WITH MINERALS) TABS Take 1 tablet by mouth daily.      . Omega-3-acid Ethyl Esters (LOVAZA PO) Take 1 capsule by mouth daily.      . pantoprazole (PROTONIX) 40 MG tablet Take 1 tablet (40 mg total) by mouth daily at 12 noon.  30 tablet  5  . vitamin B-12 100 MCG tablet Take 1 tablet (100 mcg total) by mouth daily.  30 tablet  5    Results for orders placed during the hospital encounter of 09/12/11 (from the past 48 hour(s))  CBC     Status: Abnormal   Collection Time   09/13/11 12:16 AM      Component Value Range Comment   WBC 13.3 (*) 4.0 - 10.5 (K/uL)    RBC 5.28  4.22 - 5.81 (MIL/uL)    Hemoglobin 16.8  13.0 - 17.0 (g/dL)    HCT 69.6  29.5 - 28.4 (%)    MCV 87.7  78.0 - 100.0 (fL)    MCH 31.8  26.0 - 34.0 (pg)    MCHC 36.3 (*) 30.0 - 36.0 (g/dL)    RDW 13.2  44.0 - 10.2 (%)    Platelets 329  150 - 400 (K/uL)   DIFFERENTIAL     Status: Abnormal   Collection Time  09/13/11 12:16 AM      Component Value Range Comment   Neutrophils Relative 75  43 - 77 (%)    Neutro Abs 10.0 (*) 1.7 - 7.7 (K/uL)    Lymphocytes Relative 18  12 - 46 (%)    Lymphs Abs 2.3  0.7 - 4.0 (K/uL)    Monocytes Relative 6  3 - 12 (%)    Monocytes Absolute 0.8  0.1 - 1.0 (K/uL)    Eosinophils Relative 1  0 - 5 (%)    Eosinophils Absolute 0.2  0.0 - 0.7 (K/uL)    Basophils Relative 0  0 - 1 (%)    Basophils Absolute 0.0  0.0 - 0.1 (K/uL)   COMPREHENSIVE METABOLIC PANEL     Status: Abnormal   Collection Time   09/13/11 12:16 AM      Component Value Range Comment   Sodium 140  135 - 145 (mEq/L)    Potassium 3.5  3.5 - 5.1 (mEq/L)    Chloride 103  96 - 112 (mEq/L)    CO2 23  19 - 32 (mEq/L)    Glucose, Bld 133 (*) 70 - 99 (mg/dL)    BUN 10  6 - 23 (mg/dL)    Creatinine, Ser 4.54  0.50 - 1.35 (mg/dL)    Calcium 09.8  8.4 - 10.5 (mg/dL)    Total Protein 7.9  6.0 - 8.3 (g/dL)    Albumin 4.6  3.5 - 5.2 (g/dL)    AST 25   0 - 37 (U/L)    ALT 18  0 - 53 (U/L)    Alkaline Phosphatase 46  39 - 117 (U/L)    Total Bilirubin 0.4  0.3 - 1.2 (mg/dL)    GFR calc non Af Amer 81 (*) >90 (mL/min)    GFR calc Af Amer >90  >90 (mL/min)   LIPASE, BLOOD     Status: Normal   Collection Time   09/13/11 12:16 AM      Component Value Range Comment   Lipase 34  11 - 59 (U/L)   TROPONIN I     Status: Normal   Collection Time   09/13/11  2:17 AM      Component Value Range Comment   Troponin I <0.30  <0.30 (ng/mL)    Dg Chest Portable 1 View  09/13/2011  *RADIOLOGY REPORT*  Clinical Data: Epigastric abdominal pain.  Nausea and vomiting. Smoker with history of hypertension.  PORTABLE CHEST - 1 VIEW 09/13/2011 0055 hours:  Comparison: Portable chest x-ray 07/31/2011 and two-view chest x- ray 02/13/1010.  Findings: Cardiac silhouette mildly enlarged but stable.  Thoracic aorta mildly tortuous, unchanged.  Hilar and mediastinal contours otherwise unremarkable.  Mild pulmonary venous hypertension without overt edema, unchanged.  Lungs clear.  IMPRESSION: Stable mild cardiomegaly.  No acute cardiopulmonary disease.  Original Report Authenticated By: Arnell Sieving, M.D.    Review of Systems  Constitutional: Positive for diaphoresis.  HENT: Negative.   Eyes: Negative.   Respiratory: Negative.   Cardiovascular: Negative.   Gastrointestinal: Positive for nausea, vomiting, abdominal pain and constipation. Negative for heartburn, diarrhea, blood in stool and melena.  Genitourinary: Negative.   Musculoskeletal: Negative.   Skin: Negative.   Neurological: Negative.   Endo/Heme/Allergies: Negative.   Psychiatric/Behavioral: Negative.     Blood pressure 120/76, pulse 97, temperature 98.3 F (36.8 C), temperature source Oral, resp. rate 16, height 5\' 11"  (1.803 m), weight 102.967 kg (227 lb), SpO2 97.00%. Physical Exam  Constitutional: He is  oriented to person, place, and time. He appears well-developed and well-nourished.  HENT:    Head: Normocephalic and atraumatic.  Right Ear: External ear normal.  Left Ear: External ear normal.  Nose: Nose normal.  Mouth/Throat: Oropharynx is clear and moist.  Eyes: Conjunctivae and EOM are normal. Pupils are equal, round, and reactive to light.  Neck: Normal range of motion. Neck supple.  Cardiovascular: Intact distal pulses.  An irregularly irregular rhythm present. Tachycardia present.  Exam reveals no gallop and no friction rub.   No murmur heard. Respiratory: Effort normal and breath sounds normal.  GI: Soft. Bowel sounds are normal.  Musculoskeletal: Normal range of motion.  Neurological: He is alert and oriented to person, place, and time. He has normal reflexes.  Skin: Skin is warm and dry.  Psychiatric: He has a normal mood and affect. His behavior is normal. Judgment and thought content normal.     Assessment/Plan This is a 41 year old gentleman admitted with A. fib with rapid ventricular response but also had abdominal pain nausea vomiting prior to the onset. Patient is a tobacco smoker. Also history of polysubstance abuse. Plan #1 atrial fibrillation with RVR: Patient is admitted to step down unit. He has been on Cardizem drip which we will continue with here. We will titrated onto his thyroid is fully controlled. Now the patient is not having any vomiting we'll resume his home medications. Once the oral medication kicks in and we titrate him off the drip he can probably go home. We will check and see if he had any recent echocardiogram if not we'll repeat. Check serial cardiac enzymes in case this is angina equivalent. Will also get cardiology consult. #2 Abdominal pain nausea vomiting: Most probably secondary to food poisoning leading to gastroenteritis. Symptoms are resolving now. We will manage him conservatively. #3 abnormal EKG: This is transient. We'll check serial cardiac enzymes and repeat his EKG in the morning. #4 hypertension: Continue his home  medications. #5 polysubstance abuse: Patient will be counseled and I will check a urine drug screen.  Kaziyah Parkison,LAWAL 09/13/2011, 4:53 AM

## 2011-09-14 DIAGNOSIS — I4891 Unspecified atrial fibrillation: Secondary | ICD-10-CM

## 2011-09-14 DIAGNOSIS — F1910 Other psychoactive substance abuse, uncomplicated: Secondary | ICD-10-CM

## 2011-09-14 DIAGNOSIS — R1084 Generalized abdominal pain: Secondary | ICD-10-CM

## 2011-09-14 DIAGNOSIS — I10 Essential (primary) hypertension: Secondary | ICD-10-CM

## 2011-09-14 LAB — CBC
MCH: 31.4 pg (ref 26.0–34.0)
MCV: 90.9 fL (ref 78.0–100.0)
Platelets: 276 10*3/uL (ref 150–400)
RDW: 14.5 % (ref 11.5–15.5)
WBC: 6 10*3/uL (ref 4.0–10.5)

## 2011-09-14 LAB — DRUGS OF ABUSE SCREEN W/O ALC, ROUTINE URINE
Benzodiazepines.: NEGATIVE
Cocaine Metabolites: NEGATIVE
Methadone: NEGATIVE
Opiate Screen, Urine: NEGATIVE

## 2011-09-14 LAB — CK TOTAL AND CKMB (NOT AT ARMC): Relative Index: 0.3 (ref 0.0–2.5)

## 2011-09-14 LAB — COMPREHENSIVE METABOLIC PANEL
Albumin: 3.6 g/dL (ref 3.5–5.2)
Alkaline Phosphatase: 39 U/L (ref 39–117)
BUN: 12 mg/dL (ref 6–23)
Chloride: 106 mEq/L (ref 96–112)
Glucose, Bld: 101 mg/dL — ABNORMAL HIGH (ref 70–99)
Potassium: 4.1 mEq/L (ref 3.5–5.1)
Total Bilirubin: 0.2 mg/dL — ABNORMAL LOW (ref 0.3–1.2)

## 2011-09-14 MED ORDER — NICOTINE 21 MG/24HR TD PT24
21.0000 mg | MEDICATED_PATCH | Freq: Every day | TRANSDERMAL | Status: DC
  Administered 2011-09-14: 21 mg via TRANSDERMAL
  Filled 2011-09-14 (×2): qty 1

## 2011-09-14 NOTE — Progress Notes (Signed)
Patient received via wheelchair from unit 2900. A/O, VSS, denies discomfort. Family at bedside. Lurline Idol [facility]

## 2011-09-14 NOTE — Progress Notes (Signed)
  [facility]   Subjective: Feels well. No complaints- eating well.   Objective: Vital signs in last 24 hours: Temp:  [97.5 F (36.4 C)-98.5 F (36.9 C)] 98 F (36.7 C) (05/25 1203) Pulse Rate:  [59-110] 61  (05/25 0450) Resp:  [13-23] 18  (05/25 1203) BP: (101-133)/(52-79) 133/76 mmHg (05/25 1205) SpO2:  [91 %-98 %] 98 % (05/25 1203) Weight change:  Last BM Date: 09/12/11  Intake/Output from previous day: 05/24 0701 - 05/25 0700 In: 3243.8 [P.O.:480; I.V.:2263.8; IV Piggyback:500] Out: 600 [Urine:600] Intake/Output this shift: Total I/O In: 713.3 [P.O.:200; I.V.:513.3] Out: -   General appearance: alert, cooperative, appears stated age and no distress Resp: clear to auscultation bilaterally Cardio: Irregular rate and a true fibrillation rhythm, S1, S2 normal, no murmur, click, rub or gallop; IV Cardizem infusion at 5 cc per hour GI: soft, non-tender; bowel sounds normal; no masses,  no organomegaly Extremities: extremities normal, atraumatic, no cyanosis or edema Neurologic: Grossly normal  Lab Results:  Basename 09/14/11 0635 09/13/11 0550  WBC 6.0 12.6*  HGB 14.2 15.6  HCT 41.1 44.0  PLT 276 310   BMET  Basename 09/14/11 0635 09/13/11 1248  NA 139 138  K 4.1 3.7  CL 106 104  CO2 24 23  GLUCOSE 101* 87  BUN 12 11  CREATININE 1.20 1.24  CALCIUM 8.8 9.3    Studies/Results: Dg Chest Portable 1 View  09/13/2011  *RADIOLOGY REPORT*  Clinical Data: Epigastric abdominal pain.  Nausea and vomiting. Smoker with history of hypertension.  PORTABLE CHEST - 1 VIEW 09/13/2011 0055 hours:  Comparison: Portable chest x-ray 07/31/2011 and two-view chest x- ray 02/13/1010.  Findings: Cardiac silhouette mildly enlarged but stable.  Thoracic aorta mildly tortuous, unchanged.  Hilar and mediastinal contours otherwise unremarkable.  Mild pulmonary venous hypertension without overt edema, unchanged.  Lungs clear.  IMPRESSION: Stable mild cardiomegaly.  No acute  cardiopulmonary disease.  Original Report Authenticated By: Arnell Sieving, M.D.    Medications: I have reviewed the patient's current medications.  Assessment/Plan:  Principal Problem:  *Abdominal pain - with Nausea & emesis *Likely related to acute gastroenteritis which has resolved *Tolerating solid diet at the present time  Active Problems:  Atrial fibrillation with RVR *Suspect related to volume depletion from GI illness Now back in NSR. HR in 60s. Back on oral Cardizem. D/c Metoprolol as BP is low   Leukocytosis/Dehydration/ Rhabdomyolysis *CPK 1500 - improved only to 1300s. Will increase IVF and cont to follow. I have explained to him that this is the only reason we need to continue to keep him in the hospital   Abnormal EKG/PVCs *Having frequent multifocal PVCs and occasional runs of nonsustained ventricular tachycardia Mag and phos normal.   HTN (hypertension)/ Left ventricular diastolic dysfunction, NYHA class 2 *Hold Lopressor (was newly added this admit) as BP is low   Disposition Can transfer to telemetry today  Calvert Cantor, MD [phone]  LOS: 2 days   09/14/2011, 12:43 PM

## 2011-09-14 NOTE — Progress Notes (Deleted)
Patient received via wheelchair from unit 2900. A/O, VSS, denies discomfort. Family at bedside. Raymond Short      

## 2011-09-14 NOTE — Progress Notes (Signed)
Patient ambulated independently  200 ft on room air. Patient had a steady gait with a steady pace.  Patient returned to chair resting. Will continue to monitor.

## 2011-09-14 NOTE — Progress Notes (Signed)
[facility] DAILY PROGRESS NOTE  NAME:  Raymond Short   MRN: [id number] DOB:  [birth year]   ADMIT DATE: 09/12/2011   Patient Description   41 y.o. male with PMH below: presented with SSx of acute gastroenteritis complicated by Afib with RVR & elevated CK but not troponin or CKMB.    Past Medical History  Diagnosis Date  . Paroxysmal atrial fibrillation   . Hypertension   . Polysubstance abuse     History of EtOH, Cocaine    Clinical Course: Length of Stay:  LOS: 2 days   Subjective:   Today Raymond Short  Converted to PO Diltiazem yesterday, but VR increased --> IV lopressor given along with PO dose -- pt spontaneously converted to NSR/~SB in high 50s. One episode of abdominal discomfort after eating a "souflaki" for dinner - then better after BM. Marland Kitchen Objective:  Temp:  [97.5 F (36.4 C)-98.5 F (36.9 C)] 98.4 F (36.9 C) (05/25 0758) Pulse Rate:  [50-111] 61  (05/25 0450) Resp:  [12-23] 16  (05/25 0758) BP: (106-132)/(52-79) 110/77 mmHg (05/25 0450) SpO2:  [91 %-99 %] 94 % (05/25 0758) Weight change:  Physical Exam: General appearance: alert, cooperative, appears stated age, no distress, mildly obese and normal mood & affect Neck: no adenopathy, no carotid bruit, no JVD, supple, symmetrical, trachea midline and thyroid not enlarged, symmetric, no tenderness/mass/nodules Lungs: clear to auscultation bilaterally, normal percussion bilaterally and non-labored. Heart: regular rate and rhythm, S1, S2 normal, no murmur, click, rub or gallop Abdomen: soft, non-tender; bowel sounds normal; no masses,  no organomegaly Extremities: extremities normal, atraumatic, no cyanosis or edema Pulses: 2+ and symmetric Neurologic: Grossly normal  Intake/Output from previous day: 05/24 0701 - 05/25 0700 In: 3243.8 [P.O.:480; I.V.:2263.8; IV Piggyback:500] Out: 600 [Urine:600]  Intake/Output Summary (Last 24 hours) at 09/14/11 0802 Last data filed at  09/14/11 0700  Gross per 24 hour  Intake 3113.75 ml  Output    600 ml  Net 2513.75 ml    Pertinent Lab Results: Cr 1.2 (improved), K+ 4.1 stable, LFTs - normal, CK 1370 - down from ~1500. CBC: stable  MAR Reviewed  Assessment/Plan:  Principal Problem:  *Abdominal pain - with Nausea & emesis Active Problems:   Atrial fibrillation with RVR   HTN (hypertension)   Abnormal EKG   Leukocytosis   Dehydration   Rhabdomyolysis   Left ventricular diastolic dysfunction, NYHA class 2   Plan:   Would continue Diltiazem @ 300.  As BP is ~borderline, would not titrate up BB dose - may not need on d/c.  ASA for CVA prophylaxis given CHADS2 score.     Will arrange OP f/u with Dr. Rennis Short post d/c.  Time Spent Directly with Patient:  10 minutes   Raymond Short, M.D., M.S. [facility] [address] Raymond [place], [place]  [address]  [phone]  09/14/2011 8:02 AM

## 2011-09-15 DIAGNOSIS — I4891 Unspecified atrial fibrillation: Secondary | ICD-10-CM

## 2011-09-15 DIAGNOSIS — R1084 Generalized abdominal pain: Secondary | ICD-10-CM

## 2011-09-15 DIAGNOSIS — I10 Essential (primary) hypertension: Secondary | ICD-10-CM

## 2011-09-15 DIAGNOSIS — F1910 Other psychoactive substance abuse, uncomplicated: Secondary | ICD-10-CM

## 2011-09-15 LAB — BASIC METABOLIC PANEL
BUN: 12 mg/dL (ref 6–23)
Calcium: 9.1 mg/dL (ref 8.4–10.5)
GFR calc Af Amer: >90 mL/min (ref >90)
GFR calc non Af Amer: 79 mL/min — ABNORMAL LOW (ref >90)
Glucose, Bld: 95 mg/dL (ref 70–99)
Potassium: 4.4 mEq/L (ref 3.5–5.1)
Sodium: 139 mEq/L (ref 135–145)

## 2011-09-15 MED ORDER — VITAMIN B-1 100 MG PO TABS: 100.0000 mg | ORAL_TABLET | Freq: Every day | ORAL | Status: AC

## 2011-09-15 MED ORDER — OFF THE BEAT BOOK
Freq: Once | Status: AC
  Administered 2011-09-15: 02:00:00
  Filled 2011-09-15: qty 1

## 2011-09-15 MED ORDER — PANTOPRAZOLE SODIUM 40 MG PO TBEC: 40.0000 mg | DELAYED_RELEASE_TABLET | Freq: Every day | ORAL | Status: DC

## 2011-09-15 NOTE — Progress Notes (Signed)
Patient ID: Raymond Short, male   DOB: 14-Oct-1970, 41 y.o.   MRN: [id number] Pt. Discharged 09/15/2011  9:39 AM Discharge instructions reviewed with patient/family. Patient/family verbalized understanding. All Rx's given. Questions answered as needed. Pt. Discharged to home with family/self.  Lurline Idol [facility]

## 2011-09-15 NOTE — Discharge Summary (Signed)
Physician Discharge Summary  Raymond Short ZOX:[id number] DOB: Oct 06, 1970 DOA: 09/12/2011  PCP: Thayer Headings, MD, MD  Admit date: 09/12/2011 Discharge date: 09/15/2011  Discharge Diagnoses:  Principal Problem:  *Abdominal pain - with Nausea & emesis Active Problems:  Atrial fibrillation with RVR  Abnormal EKG  HTN (hypertension)  Leukocytosis  Dehydration  Rhabdomyolysis  Left ventricular diastolic dysfunction, NYHA class 2   Discharge Condition: good  Diet recommendation: heart healthy  History of present illness:  41 year old gentleman with history of atrial fibrillation who presented with onset of abdominal pain today. Pain was centrally located rated as 7/10. Associated with nausea and vomiting but no diarrhea. He has been more less constipated. Symptoms started after he ate outside at a restaurant yesterday. He woke up with the symptoms today and believe it was food poisoning. He then started having some palpitations associated with the symptoms. When seen at the emergency [place] at [place], he was found to be in atrial fibrillation with rapid ventricular response. Cardiology was consulted because of the thought that this could be angina equivalent. Patient is not to use cocaine in addition to alcohol and tobacco. He however denied using any substance this week. His pain is now gone. He has not vomited in a couple of hours and no nausea.    Hospital Course:  1. Nausea vomiting and abdominal pain - unclear etiology - self limited - / probable gastroenetritis - patient with elevated CK due to mild dehydration and using alcohol. D/w patient importance of complete abstinence. He was treated with ppis and symptomatic treatment and his diet was advanced without problems 2. afib with rvr - ? Due to etoh - he was started initially on a cardizem drip and then he converted to nsr. Due to low chads2 score of 1 we decided to prophylax for stroke using aspirin only.    Procedures:  none  Consultations:  [facility]  Discharge Exam: Filed Vitals:   09/15/11 0300  BP: 129/86  Pulse: 67  Temp: 97.7 F (36.5 C)  Resp: 18   Filed Vitals:   09/14/11 1205 09/14/11 1300 09/14/11 1946 09/15/11 0300  BP: 133/76 117/75 124/78 129/86  Pulse:  60 59 67  Temp:  98.8 F (37.1 C) 98.6 F (37 C) 97.7 F (36.5 C)  TempSrc:  Oral Oral Oral  Resp:  18 18 18   Height:      Weight:      SpO2:  95% 98% 98%   General: axox3 Cardiovascular: RRR Respiratory: CTAB  Discharge Instructions  Discharge Orders    Future Orders Please Complete By Expires   Diet - low sodium heart healthy      Increase activity slowly        Medication List  As of 09/15/2011  9:25 AM   STOP taking these medications         DILTIAZEM HCL CD PO      LOSARTAN POTASSIUM PO      WELLBUTRIN XL PO         TAKE these medications         aspirin 81 MG EC tablet   Take 1 tablet (81 mg total) by mouth daily.      buPROPion 150 MG 24 hr tablet   Commonly known as: WELLBUTRIN XL   Take 150 mg by mouth daily.      cyanocobalamin 100 MCG tablet   Take 1 tablet (100 mcg total) by mouth daily.      diltiazem 240 MG 24  hr capsule   Commonly known as: DILACOR XR   Take 240 mg by mouth daily.      fluticasone 50 MCG/ACT nasal spray   Commonly known as: FLONASE   Place 1 spray into the nose daily.      folic acid 1 MG tablet   Commonly known as: FOLVITE   Take 1 tablet (1 mg total) by mouth daily.      losartan 50 MG tablet   Commonly known as: COZAAR   Take 50 mg by mouth daily.      LOVAZA PO   Take 1 capsule by mouth daily.      mulitivitamin with minerals Tabs   Take 1 tablet by mouth daily.      pantoprazole 40 MG tablet   Commonly known as: PROTONIX   Take 1 tablet (40 mg total) by mouth daily. For acid reflux      thiamine 100 MG tablet   Commonly known as: VITAMIN B-1   Take 1 tablet (100 mg total) by mouth daily.           Follow-up Information     Schedule an appointment as soon as possible for a visit with Thayer Headings, MD.   Contact information:   [address] [place], [address] [facility] [place] [address] [phone]           The results of significant diagnostics from this hospitalization (including imaging, microbiology, ancillary and laboratory) are listed below for reference.    Significant Diagnostic Studies: Dg Chest Portable 1 View  09/13/2011  *RADIOLOGY REPORT*  Clinical Data: Epigastric abdominal pain.  Nausea and vomiting. Smoker with history of hypertension.  PORTABLE CHEST - 1 VIEW 09/13/2011 0055 hours:  Comparison: Portable chest x-ray 07/31/2011 and two-view chest x- ray 02/13/1010.  Findings: Cardiac silhouette mildly enlarged but stable.  Thoracic aorta mildly tortuous, unchanged.  Hilar and mediastinal contours otherwise unremarkable.  Mild pulmonary venous hypertension without overt edema, unchanged.  Lungs clear.  IMPRESSION: Stable mild cardiomegaly.  No acute cardiopulmonary disease.  Original Report Authenticated By: Arnell Sieving, M.D.    Microbiology: Recent Results (from the past 240 hour(s))  MRSA PCR SCREENING     Status: Normal   Collection Time   09/13/11  4:38 AM      Component Value Range Status Comment   MRSA by PCR NEGATIVE  NEGATIVE  Final      Labs: Basic Metabolic Panel:  Lab 09/15/11 9147 09/14/11 0635 09/13/11 1248 09/13/11 0922 09/13/11 0550 09/13/11 0016  NA 139 139 138 -- -- 140  K 4.4 4.1 -- -- -- --  CL 106 106 104 -- -- 103  CO2 24 24 23  -- -- 23  GLUCOSE 95 101* 87 -- -- 133*  BUN 12 12 11  -- -- 10  CREATININE 1.12 1.20 1.24 -- 1.03 1.10  CALCIUM 9.1 8.8 9.3 -- -- 10.1  MG -- -- 2.0 1.8 -- --  PHOS -- -- 3.2 3.3 -- --   Liver Function Tests:  Lab 09/14/11 0635 09/13/11 0016  AST 21 25  ALT 15 18  ALKPHOS 39 46  BILITOT 0.2* 0.4  PROT 6.5 7.9  ALBUMIN 3.6 4.6    Lab 09/13/11 0016  LIPASE 34  AMYLASE --   No  results found for this basename: AMMONIA:5 in the last 168 hours CBC:  Lab 09/14/11 0635 09/13/11 0550 09/13/11 0016  WBC 6.0 12.6* 13.3*  NEUTROABS -- -- 10.0*  HGB 14.2 15.6  16.8  HCT 41.1 44.0 46.3  MCV 90.9 89.4 87.7  PLT 276 310 329   Cardiac Enzymes:  Lab 09/15/11 0500 09/14/11 0635 09/13/11 2058 09/13/11 1248 09/13/11 0922 09/13/11 0550 09/13/11 0217  CKTOTAL 1022* 1370* 1559* 1571* 1601* -- --  CKMB -- 4.4* 5.3* 6.7* -- 7.7* --  CKMBINDEX -- -- -- -- -- -- --  TROPONINI -- -- <0.30 <0.30 -- <0.30 <0.30   BNP: BNP (last 3 results) No results found for this basename: PROBNP:3 in the last 8760 hours CBG: No results found for this basename: GLUCAP:5 in the last 168 hours  Time coordinating discharge: 45 minutes  Signed:  Lonia Blood, MD  [facility] 09/15/2011, 9:25 AM

## 2011-11-25 ENCOUNTER — Institutional Professional Consult (permissible substitution): Payer: 59 | Admitting: Internal Medicine

## 2011-11-29 ENCOUNTER — Encounter: Payer: Self-pay | Admitting: Internal Medicine

## 2015-05-31 ENCOUNTER — Ambulatory Visit (INDEPENDENT_AMBULATORY_CARE_PROVIDER_SITE_OTHER): Payer: 59 | Admitting: Family Medicine

## 2015-05-31 VITALS — BP 140/90 | HR 86 | Temp 98.8°F | Resp 20 | Ht 70.87 in | Wt 230.4 lb

## 2015-05-31 DIAGNOSIS — I480 Paroxysmal atrial fibrillation: Secondary | ICD-10-CM | POA: Diagnosis not present

## 2015-05-31 DIAGNOSIS — M545 Low back pain, unspecified: Secondary | ICD-10-CM

## 2015-05-31 MED ORDER — CYCLOBENZAPRINE HCL 5 MG PO TABS: ORAL_TABLET | ORAL | Status: DC

## 2015-05-31 MED ORDER — NAPROXEN 500 MG PO TABS: 500.0000 mg | ORAL_TABLET | Freq: Two times a day (BID) | ORAL | Status: DC

## 2015-05-31 NOTE — Progress Notes (Signed)
Chief Complaint:  Chief Complaint  Patient presents with  . Back Pain    lower back side     HPI: Raymond Short is a 45 y.o. male who reports to [facility] today complaining of low back pain He has helping his ste[pp daughter move , he has low back pain, a knot on his lower left side, he has hurt his back before.  Deneis any numbness, weakness or tingling, he does have a different darker coloration of his left hand. No incontinence.  He denies n/v/abd pain. 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 He comes in today for something stronger.  Hx of cocaine use and etoh , sober for several years.   His tory of PAF, on diltiazem and also losartan, has not been to cardiology since 2013, was followed by [facility]  Was on coumadin but did not want to go back due to too frequent visits and high copay.  He currently denies PAF but can feel palpitations sometimes, he has no CP or dizziness, SOB or papiations currently.     Past Medical History  Diagnosis Date  . Paroxysmal atrial fibrillation (HCC)   . Hypertension   . Polysubstance abuse     History of EtOH, Cocaine   No past surgical history on file. Social History   Social History  . Marital Status: Married    Spouse Name: N/A  . Number of Children: N/A  . Years of Education: N/A   Social History Main Topics  . Smoking status: Current Every Day Smoker -- 1.00 packs/day for 24 years    Types: Cigarettes  . Smokeless tobacco: None  . Alcohol Use: 1.8 oz/week    3 Cans of beer per week     Comment: pt has hx of alcohol abuse  . Drug Use: Yes    Special: Cocaine     Comment: states smoked crack cocaine yesterday  . Sexual Activity: Not Asked   Other Topics Concern  . None   Social History Narrative   No family history on file. No Known Allergies Prior to Admission medications   Medication Sig Start Date End Date Taking? Authorizing Provider  fluticasone (FLONASE)  50 MCG/ACT nasal spray Place 1 spray into the nose daily.   Yes Historical Provider, MD  losartan (COZAAR) 50 MG tablet Take 50 mg by mouth daily.   Yes Historical Provider, MD  buPROPion (WELLBUTRIN XL) 150 MG 24 hr tablet Take 150 mg by mouth daily. Reported on 05/31/2015    Historical Provider, MD  diltiazem (DILACOR XR) 240 MG 24 hr capsule Take 240 mg by mouth daily. Reported on 05/31/2015    Historical Provider, MD  Multiple Vitamin (MULITIVITAMIN WITH MINERALS) TABS Take 1 tablet by mouth daily. Patient not taking: Reported on 05/31/2015 08/01/11   Dwana Melena, PA-C  Omega-3-acid Ethyl Esters (LOVAZA PO) Take 1 capsule by mouth daily. Reported on 05/31/2015    Historical Provider, MD  pantoprazole (PROTONIX) 40 MG tablet Take 1 tablet (40 mg total) by mouth daily. For acid reflux Patient not taking: Reported on 05/31/2015 09/15/11   Sorin Luanne Bras, MD     ROS: The patient denies fevers, chills, night sweats, unintentional weight loss, chest pain, palpitations, wheezing, dyspnea on exertion, nausea, vomiting, abdominal pain, dysuria, hematuria, melena, numbness, weakness, or tingling.   All other systems have been reviewed and were otherwise negative with the exception of  those mentioned in the HPI and as above.    PHYSICAL EXAM: Filed Vitals:   05/31/15 1242  BP: 140/90  Pulse: 86  Temp: 98.8 F (37.1 C)  Resp: 20   Body mass index is 32.26 kg/(m^2).   General: Alert, no acute distress HEENT:  Normocephalic, atraumatic, oropharynx patent. EOMI, PERRLA Cardiovascular:  Regular rate and rhythm, no rubs murmurs or gallops.  No Carotid bruits, radial pulse intact. No pedal edema.  Respiratory: Clear to auscultation bilaterally.  No wheezes, rales, or rhonchi.  No cyanosis, no use of accessory musculature Abdominal: No organomegaly, abdomen is soft and non-tender, positive bowel sounds. No masses. Skin: No rashes. Neurologic: Facial musculature symmetric. Psychiatric: Patient acts  appropriately throughout our interaction. Lymphatic: No cervical or submandibular lymphadenopathy Musculoskeletal: Gait intact. No edema, tenderness + paramsk tenderness  Right sided low back pain  Decrease ROM 5/5 strength, 2/2 DTRs No saddle anesthesia Straight leg negative Hip and knee exam--normal      LABS:    EKG/XRAY:   Primary read interpreted by Dr. Conley Rolls at [facility].   ASSESSMENT/PLAN: Encounter Diagnoses  Name Primary?  . Midline low back pain without sciatica Yes  . Paroxysmal atrial fibrillation (HCC)    45 y/o male with PMH of PAF , former abuse of etoh and cocaine sober for several years, HTN, tobacco use who presents with LBP due helping step daughter move He declines xrays  Refer to Cardiology for PAF fu Send Dr Thea Silversmith note.  Rx flexeril , naproxen Precautions given  No    Gross sideeffects, risk and benefits, and alternatives of medications d/w patient. Patient is aware that all medications have potential sideeffects and we are unable to predict every sideeffect or drug-drug interaction that may occur.  Shakeia Krus DO  05/31/2015 2:47 PM

## 2015-06-22 ENCOUNTER — Ambulatory Visit (INDEPENDENT_AMBULATORY_CARE_PROVIDER_SITE_OTHER): Payer: 59 | Admitting: Cardiovascular Disease

## 2015-06-22 ENCOUNTER — Encounter: Payer: Self-pay | Admitting: Cardiovascular Disease

## 2015-06-22 VITALS — BP 146/98 | HR 72 | Ht 71.0 in | Wt 237.4 lb

## 2015-06-22 DIAGNOSIS — I10 Essential (primary) hypertension: Secondary | ICD-10-CM | POA: Diagnosis not present

## 2015-06-22 DIAGNOSIS — R0602 Shortness of breath: Secondary | ICD-10-CM | POA: Diagnosis not present

## 2015-06-22 DIAGNOSIS — R079 Chest pain, unspecified: Secondary | ICD-10-CM

## 2015-06-22 DIAGNOSIS — F1010 Alcohol abuse, uncomplicated: Secondary | ICD-10-CM

## 2015-06-22 DIAGNOSIS — I480 Paroxysmal atrial fibrillation: Secondary | ICD-10-CM

## 2015-06-22 LAB — CBC
HEMATOCRIT: 44.4 % (ref 39.0–52.0)
HEMOGLOBIN: 15.9 g/dL (ref 13.0–17.0)
MCH: 33.8 pg (ref 26.0–34.0)
MCHC: 35.8 g/dL (ref 30.0–36.0)
MCV: 94.3 fL (ref 78.0–100.0)
MPV: 11.3 fL (ref 8.6–12.4)
Platelets: 272 10*3/uL (ref 150–400)
RBC: 4.71 MIL/uL (ref 4.22–5.81)
RDW: 14.5 % (ref 11.5–15.5)
WBC: 7 10*3/uL (ref 4.0–10.5)

## 2015-06-22 MED ORDER — CARVEDILOL 12.5 MG PO TABS: 12.5000 mg | ORAL_TABLET | Freq: Two times a day (BID) | ORAL | Status: DC

## 2015-06-22 MED ORDER — APIXABAN 5 MG PO TABS: 5.0000 mg | ORAL_TABLET | Freq: Two times a day (BID) | ORAL | Status: DC

## 2015-06-22 NOTE — Progress Notes (Signed)
Cardiology Office Note   Date:  06/22/2015   ID:  JAISHAWN Short, DOB 12/02/1970, MRN [id number]  PCP:  Thayer Headings, MD  Cardiologist:   Madilyn Hook, MD   Chief Complaint  Patient presents with  . New Patient (Initial Visit)    has had some chest pain, occassional shortness of breath, no edema, no pain or cramping in legs, sometimes has lightheadedness or dizziness, has fatigue     History of Present Illness: Raymond Short is a 45 y.o. male with hypertension and atrial fibrillation who presents for follow up on atrial fibrillation.  [name] was recently seen by his PCP, Dr. Hamilton Capri, on 05/30/14.  At that appointment it was noted that he has not been evaluated by a cardiologist for his atrial fibrillation since 2013.  He was fairly asymptomatic but did note occasional palpitations.  He was referred to cardiology for further evaluation.  [name] atrial fibrillaiton was initially attributed to heavy alcohol abuse.  Lately he has noted increasing episodes of atrial fibrillation.  He feels palpitations that last several minutes at a time. He sometimes has associated lightheadedness and shortness of breath. He does not get chest pain with the palpitations. He denies syncope.  In the past he took Coumadin for anticoagulation but stopped coming because he was tired of getting his INRs checked.  [name] has also noted chest pain and left arm weakness. The pain has been ongoing for the last 3 or 4 months. It occurs typically at rest and happens approximately twice per day. Each episode lasts for a few seconds at a time.It is associated with shortness of breath. There is no nausea, vomiting, or diaphoresis. He does exercise at the gym and denies exertional chest pain but has noticed that his left arm feels weaker than his right arm when he bench presses.  [name] denies lower extremity edema or orthopnea.  He does endorse PND and heavy snoring.  He has noticed that  he has increased shortness of breath when walking up steps lately.   Past Medical History  Diagnosis Date  . Paroxysmal atrial fibrillation (HCC)   . Hypertension   . Polysubstance abuse     History of EtOH, Cocaine    No past surgical history on file.   Current Outpatient Prescriptions  Medication Sig Dispense Refill  . azelastine (ASTELIN) 0.1 % nasal spray Place 2 sprays into both nostrils daily. Use in each nostril as directed    . buPROPion (WELLBUTRIN XL) 300 MG 24 hr tablet Take 300 mg by mouth daily.    . cyclobenzaprine (FLEXERIL) 5 MG tablet 1-2 tabs po every 8 hours as needed for muscle pain/spasm 30 tablet 1  . diltiazem (TIAZAC) 300 MG 24 hr capsule Take 300 mg by mouth daily.    . fenofibrate 160 MG tablet Take 160 mg by mouth daily.    Marland Kitchen ibuprofen (ADVIL,MOTRIN) 600 MG tablet Take 600 mg by mouth every 6 (six) hours as needed.    Marland Kitchen levocetirizine (XYZAL) 5 MG tablet Take 5 mg by mouth every evening.    . Multiple Vitamin (MULITIVITAMIN WITH MINERALS) TABS Take 1 tablet by mouth daily.    . Omega-3-acid Ethyl Esters (LOVAZA PO) Take 1 capsule by mouth daily. Reported on 05/31/2015    . apixaban (ELIQUIS) 5 MG TABS tablet Take 1 tablet (5 mg total) by mouth 2 (two) times daily. 60 tablet 5  . carvedilol (COREG) 12.5 MG tablet Take 1 tablet (  12.5 mg total) by mouth 2 (two) times daily. 670 tablet 5   No current facility-administered medications for this visit.    Allergies:   Review of patient's allergies indicates no known allergies.    Social History:  The patient  reports that he has been smoking Cigarettes.  He has a 24 pack-year smoking history. He does not have any smokeless tobacco history on file. He reports that he drinks about 4.8 oz of alcohol per week. He reports that he uses illicit drugs (Cocaine).   Family History:  The patient's family history includes Cirrhosis in his father; Heart failure in his mother; Prostate cancer in his maternal grandfather;  Stroke in his maternal grandfather.    ROS:  Please see the history of present illness.   Otherwise, review of systems are positive for none.   All other systems are reviewed and negative.    PHYSICAL EXAM: VS:  BP 146/98 mmHg  Pulse 72  Ht  (1.803 m)  Wt 107.701 kg (237 lb 7 oz)  BMI 33.13 kg/m2 , BMI Body mass index is 33.13 kg/(m^2). GENERAL:  Well appearing HEENT:  Pupils equal round and reactive, fundi not visualized, oral mucosa unremarkable NECK:  No jugular venous distention, waveform within normal limits, carotid upstroke brisk and symmetric, no bruits, no thyromegaly LYMPHATICS:  No cervical adenopathy LUNGS:  Clear to auscultation bilaterally HEART:  RRR.  PMI not displaced or sustained,S1 and S2 within normal limits, no S3, no S4, no clicks, no rubs, no murmurs ABD:  Flat, positive bowel sounds normal in frequency in pitch, no bruits, no rebound, no guarding, no midline pulsatile mass, no hepatomegaly, no splenomegaly EXT:  2 plus pulses throughout, no edema, no cyanosis no clubbing SKIN:  No rashes no nodules NEURO:  Cranial nerves II through XII grossly intact, motor grossly intact throughout PSYCH:  Cognitively intact, oriented to person place and time   EKG:  EKG is ordered today. The ekg ordered today demonstrates sinus rhythm. Rate 72 bpm. Nonspecific T wave abnormalities.  Echo 08/01/11: LVEF 55-60%.  Grade 2 diastoic dysfunction.    Exercise Myoview 02/22/10: LVEF 57%.  No ischemia.  13 METs.   Recent Labs: No results found for requested labs within last 365 days.    Lipid Panel No results found for: CHOL, TRIG, HDL, CHOLHDL, VLDL, LDLCALC, LDLDIRECT    Wt Readings from Last 3 Encounters:  06/22/15 107.701 kg (237 lb 7 oz)  05/31/15 104.509 kg (230 lb 6.4 oz)  09/13/11 102.967 kg (227 lb)      ASSESSMENT AND PLAN:  # Paroxysmal atrial fibrillation: [name] is having episodes of atrial fibrillation.  We will start carvedilol 12.5 mg twice a  day given that his blood pressure is also poorly controlled.  He will continue diltiazem at the current dosing.  Reccommend starting anticoagulation given that this patients CHA2DS2-VASc Score and unadjusted Ischemic Stroke Rate (% per year) is equal to 0.6 % stroke rate/year from a score of 1.  He will start Eliqus 5 mg twice a day.We will check a basic metabolic panel and CBC today.  Above score calculated as 1 point each if present [CHF, HTN, DM, Vascular=MI/PAD/Aortic Plaque, Age if 65-74, or Male] Above score calculated as 2 points each if present [Age > 75, or Stroke/TIA/TE]   # Chest pain: symptoms are atypical. However, we will obtain a exercise Cardiolite to evaluate for ischemia.  # Hypertension: Blood pressure is slightly above goal. Given that we are starting  carvedilol, we will stop his losartan. Continue diltiazem. He will return in 2 weeks for a blood pressure check.  # Alcohol abuse:  We discussed the fact that heavy alcohol use can contribute to atrial fibrillation. I recommended that given his history of substance abuse abstinence would be best. However, if he is unable to do this, no more than 2 drinks at a sitting and no more than 14 in a week.   Current medicines are reviewed at length with the patient today.  The patient does not have concerns regarding medicines.  The following changes have been made:  Stop losartan and start carvedilol 12.5 mg twice a day.  Labs/ tests ordered today include:   Orders Placed This Encounter  Procedures  . CBC  . Basic metabolic panel  . Myocardial Perfusion Imaging  . EKG 12-Lead     Disposition:   FU with Yaretsi Humphres C. Duke Salvia, MD, [facility] in 2 weeks.     This note was written with the assistance of speech recognition software.  Please excuse any transcriptional errors.  Signed, Rache Klimaszewski C. Duke Salvia, MD, [facility]  06/22/2015 4:42 PM    [facility]

## 2015-06-22 NOTE — Patient Instructions (Addendum)
Your physician has recommended you make the following change in your medication:   STOP LOSARTAN  START CARVEDILOL 12.5 MG TWICE DAILY  START ELIQUIS 5 MG TWICE DAILY   Your physician recommends that you return for lab work in: TODAY AT [facility]   Your physician has requested that you have en exercise stress myoview. For further information please visit [email or web address] Please follow instruction sheet, as given.  Your physician recommends that you schedule a follow-up appointment in: 2 WEEKS

## 2015-06-23 LAB — BASIC METABOLIC PANEL
BUN: 12 mg/dL (ref 7–25)
CHLORIDE: 102 mmol/L (ref 98–110)
CO2: 23 mmol/L (ref 20–31)
CREATININE: 1.32 mg/dL (ref 0.60–1.35)
Calcium: 9.6 mg/dL (ref 8.6–10.3)
GLUCOSE: 102 mg/dL — AB (ref 65–99)
POTASSIUM: 4.2 mmol/L (ref 3.5–5.3)
Sodium: 135 mmol/L (ref 135–146)

## 2015-06-28 ENCOUNTER — Telehealth (HOSPITAL_COMMUNITY): Payer: Self-pay

## 2015-06-28 NOTE — Telephone Encounter (Signed)
Encounter complete. 

## 2015-06-30 ENCOUNTER — Encounter: Payer: Self-pay | Admitting: *Deleted

## 2015-06-30 ENCOUNTER — Ambulatory Visit (HOSPITAL_COMMUNITY)
Admission: RE | Admit: 2015-06-30 | Discharge: 2015-06-30 | Disposition: A | Discharge status: Home / Self Care | Payer: 59 | Source: Ambulatory Visit | Attending: Cardiovascular Disease | Admitting: Cardiovascular Disease

## 2015-06-30 DIAGNOSIS — Z8249 Family history of ischemic heart disease and other diseases of the circulatory system: Secondary | ICD-10-CM | POA: Diagnosis not present

## 2015-06-30 DIAGNOSIS — R42 Dizziness and giddiness: Secondary | ICD-10-CM | POA: Insufficient documentation

## 2015-06-30 DIAGNOSIS — F17200 Nicotine dependence, unspecified, uncomplicated: Secondary | ICD-10-CM | POA: Diagnosis not present

## 2015-06-30 DIAGNOSIS — R0602 Shortness of breath: Secondary | ICD-10-CM | POA: Diagnosis not present

## 2015-06-30 DIAGNOSIS — Z6833 Body mass index (BMI) 33.0-33.9, adult: Secondary | ICD-10-CM | POA: Diagnosis not present

## 2015-06-30 DIAGNOSIS — R079 Chest pain, unspecified: Secondary | ICD-10-CM | POA: Diagnosis not present

## 2015-06-30 DIAGNOSIS — I10 Essential (primary) hypertension: Secondary | ICD-10-CM | POA: Insufficient documentation

## 2015-06-30 DIAGNOSIS — R5383 Other fatigue: Secondary | ICD-10-CM | POA: Diagnosis not present

## 2015-06-30 DIAGNOSIS — E663 Overweight: Secondary | ICD-10-CM | POA: Diagnosis not present

## 2015-06-30 LAB — MYOCARDIAL PERFUSION IMAGING
CHL CUP MPHR: 173 {beats}/min
CHL CUP NUCLEAR SDS: 0
CHL CUP RESTING HR STRESS: 68 {beats}/min
CHL RATE OF PERCEIVED EXERTION: 17
CSEPEDS: 0 s
CSEPEW: 12.8 METS
CSEPHR: 87 %
CSEPPHR: 151 {beats}/min
Exercise duration (min): 12 min
LVDIAVOL: 153 mL (ref 62–150)
LVSYSVOL: 67 mL
NUC STRESS TID: 0.99
SRS: 0
SSS: 0

## 2015-06-30 MED ORDER — TECHNETIUM TC 99M SESTAMIBI GENERIC - CARDIOLITE
30.0000 | Freq: Once | INTRAVENOUS | Status: AC | PRN
  Administered 2015-06-30: 30 via INTRAVENOUS

## 2015-06-30 MED ORDER — TECHNETIUM TC 99M SESTAMIBI GENERIC - CARDIOLITE
10.2000 | Freq: Once | INTRAVENOUS | Status: AC | PRN
  Administered 2015-06-30: 10.2 via INTRAVENOUS

## 2015-07-03 ENCOUNTER — Encounter: Payer: Self-pay | Admitting: *Deleted

## 2015-07-03 NOTE — Telephone Encounter (Signed)
This encounter was created in error - please disregard.

## 2015-07-14 ENCOUNTER — Ambulatory Visit: Payer: 59 | Admitting: Internal Medicine

## 2015-07-21 ENCOUNTER — Ambulatory Visit (INDEPENDENT_AMBULATORY_CARE_PROVIDER_SITE_OTHER): Payer: 59 | Admitting: Cardiovascular Disease

## 2015-07-21 ENCOUNTER — Encounter: Payer: Self-pay | Admitting: Cardiovascular Disease

## 2015-07-21 VITALS — BP 134/98 | HR 67 | Ht 71.0 in | Wt 235.6 lb

## 2015-07-21 DIAGNOSIS — E781 Pure hyperglyceridemia: Secondary | ICD-10-CM | POA: Diagnosis not present

## 2015-07-21 DIAGNOSIS — R0602 Shortness of breath: Secondary | ICD-10-CM

## 2015-07-21 DIAGNOSIS — I10 Essential (primary) hypertension: Secondary | ICD-10-CM | POA: Diagnosis not present

## 2015-07-21 DIAGNOSIS — I480 Paroxysmal atrial fibrillation: Secondary | ICD-10-CM

## 2015-07-21 DIAGNOSIS — R079 Chest pain, unspecified: Secondary | ICD-10-CM

## 2015-07-21 MED ORDER — CARVEDILOL 25 MG PO TABS: 25.0000 mg | ORAL_TABLET | Freq: Two times a day (BID) | ORAL | Status: DC

## 2015-07-21 NOTE — Patient Instructions (Signed)
Medication Instructions:  INCREASE YOUR CARVEDILOL TO 25 MG TWICE A DAY  Labwork: Fasting lipid early next week and again in 2 months   Testing/Procedures: none  Follow-Up: Your physician recommends that you schedule a follow-up appointment in: 10 weeks  Any Other Special Instructions Will Be Listed Below (If Applicable). CALL THE OFFICE IF YOUR BLOOD PRESSURE IS ABOVE 140/90  If you need a refill on your cardiac medications before your next appointment, please call your pharmacy.

## 2015-07-21 NOTE — Progress Notes (Signed)
Cardiology Office Note   Date:  07/24/2015   ID:  SAYED APOSTOL, DOB Sep 27, 1970, MRN [id number]  PCP:  Thayer Headings, MD  Cardiologist:   Madilyn Hook, MD   Chief Complaint  Patient presents with  . Follow-up    myoview  pt states chest pain has subsided, c/o dizziness a few times, elevated BP, and has felt his heart racing occasionally   Patient ID: DEMONTE DOBRATZ is a 45 y.o. male with hypertension, hypertriglyceridemia and atrial fibrillation who presents for follow up on atrial fibrillation.  Interval History 07/21/15: At his last appointment Mr. Holquin was started on Eliquis for atrial fibrillation.  He also had an exercise Cardiolite that revealed normal systolic function and excellent exercise capacity.  His blood pressure was extremely elevated with exercise (240/80).  At the last appointment his blood pressure was above goal so he was started on carvedilol.  Losartan was discontinued.  He doesn't check his blood pressure regularly.  He has cut back on his alcohol intake and now typically has 2 beers daily and no hard liquor.  He denies chest pain or shortness of breath.  He hasn't noted any lower extremity edema, orthopnea or PND.  He occasionally notes palpitations but denies shortness of breath, lightheadedness or dizziness.  History of Present Illness 06/22/15:  Mr. Latouche was recently seen by his PCP, Dr. Hamilton Capri, on 05/30/14.  At that appointment it was noted that he has not been evaluated by a cardiologist for his atrial fibrillation since 2013.  He was fairly asymptomatic but did note occasional palpitations.  He was referred to cardiology for further evaluation.  Mr. Desmith atrial fibrillaiton was initially attributed to heavy alcohol abuse.  Lately he has noted increasing episodes of atrial fibrillation.  He feels palpitations that last several minutes at a time. He sometimes has associated lightheadedness and shortness of breath. He does not get chest  pain with the palpitations. He denies syncope.  In the past he took Coumadin for anticoagulation but stopped coming because he was tired of getting his INRs checked.  Mr. Beehler has also noted chest pain and left arm weakness. The pain has been ongoing for the last 3 or 4 months. It occurs typically at rest and happens approximately twice per day. Each episode lasts for a few seconds at a time.It is associated with shortness of breath. There is no nausea, vomiting, or diaphoresis. He does exercise at the gym and denies exertional chest pain but has noticed that his left arm feels weaker than his right arm when he bench presses.  Mr. Sautter denies lower extremity edema or orthopnea.  He does endorse PND and heavy snoring.  He has noticed that he has increased shortness of breath when walking up steps lately.   Past Medical History  Diagnosis Date  . Paroxysmal atrial fibrillation (HCC)   . Hypertension   . Polysubstance abuse     History of EtOH, Cocaine  . Hypertriglyceridemia 07/24/2015    No past surgical history on file.   Current Outpatient Prescriptions  Medication Sig Dispense Refill  . apixaban (ELIQUIS) 5 MG TABS tablet Take 1 tablet (5 mg total) by mouth 2 (two) times daily. 60 tablet 5  . azelastine (ASTELIN) 0.1 % nasal spray Place 2 sprays into both nostrils daily. Use in each nostril as directed    . buPROPion (WELLBUTRIN XL) 300 MG 24 hr tablet Take 300 mg by mouth daily.    . carvedilol (COREG) 25  MG tablet Take 1 tablet (25 mg total) by mouth 2 (two) times daily. 60 tablet 3  . cyclobenzaprine (FLEXERIL) 5 MG tablet 1-2 tabs po every 8 hours as needed for muscle pain/spasm 30 tablet 1  . diltiazem (TIAZAC) 300 MG 24 hr capsule Take 300 mg by mouth daily.    . fenofibrate 160 MG tablet Take 160 mg by mouth daily.    Marland Kitchen ibuprofen (ADVIL,MOTRIN) 600 MG tablet Take 600 mg by mouth every 6 (six) hours as needed.    Marland Kitchen levocetirizine (XYZAL) 5 MG tablet Take 5 mg by mouth every  evening.    . Multiple Vitamin (MULITIVITAMIN WITH MINERALS) TABS Take 1 tablet by mouth daily.    . Omega-3-acid Ethyl Esters (LOVAZA PO) Take 1 capsule by mouth daily. Reported on 05/31/2015     No current facility-administered medications for this visit.    Allergies:   Review of patient's allergies indicates no known allergies.    Social History:  The patient  reports that he has been smoking Cigarettes.  He has a 24 pack-year smoking history. He does not have any smokeless tobacco history on file. He reports that he drinks about 4.8 oz of alcohol per week. He reports that he uses illicit drugs (Cocaine).   Family History:  The patient's family history includes Cirrhosis in his father; Heart failure in his mother; Prostate cancer in his maternal grandfather; Stroke in his maternal grandfather.    ROS:  Please see the history of present illness.   Otherwise, review of systems are positive for none.   All other systems are reviewed and negative.    PHYSICAL EXAM: VS:  BP 134/98 mmHg  Pulse 67  Ht  (1.803 m)  Wt 106.867 kg (235 lb 9.6 oz)  BMI 32.87 kg/m2 , BMI Body mass index is 32.87 kg/(m^2). GENERAL:  Well appearing HEENT:  Pupils equal round and reactive, fundi not visualized, oral mucosa unremarkable NECK:  No jugular venous distention, waveform within normal limits, carotid upstroke brisk and symmetric, no bruits, no thyromegaly LYMPHATICS:  No cervical adenopathy LUNGS:  Clear to auscultation bilaterally HEART:  RRR.  PMI not displaced or sustained,S1 and S2 within normal limits, no S3, no S4, no clicks, no rubs, no murmurs ABD:  Flat, positive bowel sounds normal in frequency in pitch, no bruits, no rebound, no guarding, no midline pulsatile mass, no hepatomegaly, no splenomegaly EXT:  2 plus pulses throughout, no edema, no cyanosis no clubbing SKIN:  No rashes no nodules NEURO:  Cranial nerves II through XII grossly intact, motor grossly intact throughout PSYCH:   Cognitively intact, oriented to person place and time   EKG:  EKG is ordered today. The ekg ordered today demonstrates sinus rhythm. Rate 72 bpm. Nonspecific T wave abnormalities.  Echo 08/01/11: LVEF 55-60%.  Grade 2 diastoic dysfunction.    Exercise Myoview 02/22/10: LVEF 57%.  No ischemia.  13 METs.    Exercise Cardiolite 06/30/15:  Nuclear stress EF: 56%.  The left ventricular ejection fraction is normal (55-65%).  Blood pressure demonstrated a hypertensive response to exercise.  There was no ST segment deviation noted during stress.  The study is normal.  This is a low risk study.  Normal exercise nuclear stress test with no prior infarct and no ischemia. Excellent exercise capacity. Severe hypertensive response to stress (240/80 mmHg).  Recent Labs: 06/22/2015: BUN 12; Creat 1.32; Hemoglobin 15.9; Platelets 272; Potassium 4.2; Sodium 135    Lipid Panel No results found for:  CHOL, TRIG, HDL, CHOLHDL, VLDL, LDLCALC, LDLDIRECT    Wt Readings from Last 3 Encounters:  07/21/15 106.867 kg (235 lb 9.6 oz)  06/30/15 107.502 kg (237 lb)  06/22/15 107.701 kg (237 lb 7 oz)      ASSESSMENT AND PLAN:  # Paroxysmal atrial fibrillation: Mr. Elijah BirkCaldwell is having episodes of atrial fibrillation.  Given that his blood pressure is also above goal, we will increase carvedilol to 25 mg bid.  Continue diltiazem 300 mg daily and Apixaban.  This  patient's CHA2DS2-VASc Score and unadjusted Ischemic Stroke Rate (% per year) is equal to 0.6 % stroke rate/year from a score of 1.  Above score calculated as 1 point each if present [CHF, HTN, DM, Vascular=MI/PAD/Aortic Plaque, Age if 65-74, or Male] Above score calculated as 2 points each if present [Age > 75, or Stroke/TIA/TE]  # Hypertension: Blood pressure is slightly above goal. Increase carvedilol to 25 mg and continue diltiazem.  If BP remains elevated we may need to resume losartan.     # Alcohol abuse:  # Hypertriglyceridemia:   Mr. Elijah BirkCaldwell has significantly reduced his alcohol intake.  He notes a longstanding history of hypertriglyceridemia, which is also affected by his alcohol intake.  He is going to stop drinking completely and repeat his lipids in 2 months to determine how much this is contributing to his elevated triglycerides.  Continue fenofibrate and Lovaza.  We will check lipids now and in 2 months.  Current medicines are reviewed at length with the patient today.  The patient does not have concerns regarding medicines.  The following changes have been made:  Increase carvedilol to 25 mg bid.  Labs/ tests ordered today include:   Orders Placed This Encounter  Procedures  . Lipid panel  . Lipid panel  . Lipid panel  . Lipid panel     Disposition:   FU with Chawn Spraggins C. Duke Salviaandolph, MD, [facility] in 10 weeks.     This note was written with the assistance of speech recognition software.  Please excuse any transcriptional errors.  Signed, Dwan Hemmelgarn C. Duke Salviaandolph, MD, [facility]  07/24/2015 8:30 PM    [facility]

## 2015-07-24 ENCOUNTER — Encounter: Payer: Self-pay | Admitting: Cardiovascular Disease

## 2015-07-24 DIAGNOSIS — E785 Hyperlipidemia, unspecified: Secondary | ICD-10-CM | POA: Insufficient documentation

## 2015-07-24 DIAGNOSIS — E781 Pure hyperglyceridemia: Secondary | ICD-10-CM

## 2015-07-24 HISTORY — DX: Pure hyperglyceridemia: E78.1

## 2015-07-24 LAB — LIPID PANEL
CHOLESTEROL: 340 mg/dL — AB (ref 125–200)
HDL: 20 mg/dL — AB (ref >=40)
TRIGLYCERIDES: 2838 mg/dL — AB (ref <150)
Total CHOL/HDL Ratio: 17 Ratio — ABNORMAL HIGH (ref <=5.0)

## 2015-07-24 LAB — LDL CHOLESTEROL, DIRECT: Direct LDL: 68 mg/dL (ref <130)

## 2015-10-02 ENCOUNTER — Ambulatory Visit: Payer: 59 | Admitting: Cardiovascular Disease

## 2015-11-08 ENCOUNTER — Ambulatory Visit: Payer: 59 | Admitting: Cardiovascular Disease

## 2015-12-01 ENCOUNTER — Encounter: Payer: Self-pay | Admitting: *Deleted

## 2015-12-02 LAB — LIPID PANEL
CHOL/HDL RATIO: 9.5 ratio — AB (ref <=5.0)
CHOLESTEROL: 208 mg/dL — AB (ref 125–200)
HDL: 22 mg/dL — AB (ref >=40)
Triglycerides: 1084 mg/dL — ABNORMAL HIGH (ref <150)

## 2015-12-02 LAB — LDL CHOLESTEROL, DIRECT: Direct LDL: 111 mg/dL (ref <130)

## 2015-12-07 ENCOUNTER — Ambulatory Visit: Payer: 59 | Admitting: Cardiovascular Disease

## 2015-12-07 ENCOUNTER — Encounter: Payer: Self-pay | Admitting: *Deleted

## 2015-12-07 DIAGNOSIS — R0989 Other specified symptoms and signs involving the circulatory and respiratory systems: Secondary | ICD-10-CM

## 2015-12-07 NOTE — Progress Notes (Signed)
Letter mailed

## 2015-12-11 ENCOUNTER — Telehealth: Payer: Self-pay | Admitting: *Deleted

## 2015-12-11 NOTE — Telephone Encounter (Signed)
-----   Message from Chilton Siiffany [place], MD sent at 12/08/2015  1:39 PM EDT ----- Triglycerides are much better than before but still significantly elevated.  Increase Lovaza to 1g twice daily.  Follow up in 2 months.  Please get cholesterol checked sometime in the week before the appointment

## 2015-12-11 NOTE — Telephone Encounter (Signed)
Left message to call back  

## 2016-01-18 ENCOUNTER — Encounter: Payer: Self-pay | Admitting: *Deleted

## 2016-01-18 NOTE — Telephone Encounter (Signed)
Mailed letter to contact office regarding labs  Notes Recorded by Stann MainlandJennifer O Clark, RN on 12/15/2015 at 9:17 AM EDT No answer. Left message to call back.   ------  Notes Recorded by Stann MainlandJennifer O Clark, RN on 12/14/2015 at 3:37 PM EDT No answer. Left message to call back.

## 2016-06-23 ENCOUNTER — Emergency Department (HOSPITAL_BASED_OUTPATIENT_CLINIC_OR_DEPARTMENT_OTHER): Payer: Self-pay

## 2016-06-23 ENCOUNTER — Emergency Department (HOSPITAL_BASED_OUTPATIENT_CLINIC_OR_DEPARTMENT_OTHER)
Admission: EM | Admit: 2016-06-23 | Discharge: 2016-06-23 | Disposition: A | Discharge status: Home / Self Care | Payer: Self-pay | Attending: Physician Assistant | Admitting: Physician Assistant

## 2016-06-23 ENCOUNTER — Encounter (HOSPITAL_BASED_OUTPATIENT_CLINIC_OR_DEPARTMENT_OTHER): Payer: Self-pay | Admitting: Emergency Medicine

## 2016-06-23 DIAGNOSIS — Y9389 Activity, other specified: Secondary | ICD-10-CM | POA: Insufficient documentation

## 2016-06-23 DIAGNOSIS — S01112A Laceration without foreign body of left eyelid and periocular area, initial encounter: Secondary | ICD-10-CM | POA: Insufficient documentation

## 2016-06-23 DIAGNOSIS — F17210 Nicotine dependence, cigarettes, uncomplicated: Secondary | ICD-10-CM | POA: Insufficient documentation

## 2016-06-23 DIAGNOSIS — Y929 Unspecified place or not applicable: Secondary | ICD-10-CM | POA: Insufficient documentation

## 2016-06-23 DIAGNOSIS — Y999 Unspecified external cause status: Secondary | ICD-10-CM | POA: Insufficient documentation

## 2016-06-23 DIAGNOSIS — I10 Essential (primary) hypertension: Secondary | ICD-10-CM | POA: Insufficient documentation

## 2016-06-23 DIAGNOSIS — Z79899 Other long term (current) drug therapy: Secondary | ICD-10-CM | POA: Insufficient documentation

## 2016-06-23 MED ORDER — TETANUS-DIPHTH-ACELL PERTUSSIS 5-2.5-18.5 LF-MCG/0.5 IM SUSP
0.5000 mL | Freq: Once | INTRAMUSCULAR | Status: AC
  Administered 2016-06-23: 0.5 mL via INTRAMUSCULAR
  Filled 2016-06-23: qty 0.5

## 2016-06-23 MED ORDER — IBUPROFEN 800 MG PO TABS
800.0000 mg | ORAL_TABLET | Freq: Once | ORAL | Status: AC
  Administered 2016-06-23: 800 mg via ORAL
  Filled 2016-06-23: qty 1

## 2016-06-23 MED ORDER — ERYTHROMYCIN 5 MG/GM OP OINT
TOPICAL_OINTMENT | Freq: Once | OPHTHALMIC | Status: AC
  Administered 2016-06-23: 1 via OPHTHALMIC
  Filled 2016-06-23: qty 3.5

## 2016-06-23 MED ORDER — TETRACAINE HCL 0.5 % OP SOLN
1.0000 [drp] | Freq: Once | OPHTHALMIC | Status: AC
  Administered 2016-06-23: 1 [drp] via OPHTHALMIC
  Filled 2016-06-23: qty 4

## 2016-06-23 MED ORDER — FLUORESCEIN SODIUM 0.6 MG OP STRP
1.0000 | ORAL_STRIP | Freq: Once | OPHTHALMIC | Status: AC
  Administered 2016-06-23: 1 via OPHTHALMIC
  Filled 2016-06-23: qty 1

## 2016-06-23 NOTE — Discharge Instructions (Signed)
You need to follow up with Dr. Simeon CraftAndrew Mincey TOMORROW at [facility].  His phone number is [phone]  You need to be evaluated by an eye specialist within the next 24 hours. It is possible that you would need surgery.  Put the eye drops in every couple of hours. If your symptoms get worse, return to the ED.   You could try [facility] - [phone]. And ask if they have appointments for uninsured for cheaper.    --  To find a primary care or specialty doctor please call [phone] or [phone] to access "[facility] Find a Doctor Service."  You may also go on the [facility] website at [email or web address]  There are also multiple [facility] practices throughout the [place] that are frequently accepting new patients. You may find a clinic that is close to your home and contact them.  [facility] -  [address] [place] [id number] [phone]  [facility] in [place] (also locations in [place] and [place]) -  [address] [place] [address] [phone]  [facility] -  [address] [place] [address] [phone]
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
## 2017-01-01 ENCOUNTER — Encounter (HOSPITAL_BASED_OUTPATIENT_CLINIC_OR_DEPARTMENT_OTHER): Payer: Self-pay | Admitting: *Deleted

## 2017-01-01 ENCOUNTER — Emergency Department (HOSPITAL_BASED_OUTPATIENT_CLINIC_OR_DEPARTMENT_OTHER)
Admission: EM | Admit: 2017-01-01 | Discharge: 2017-01-01 | Disposition: A | Discharge status: Home / Self Care | Payer: 59 | Attending: Emergency Medicine | Admitting: Emergency Medicine

## 2017-01-01 DIAGNOSIS — Z79899 Other long term (current) drug therapy: Secondary | ICD-10-CM | POA: Insufficient documentation

## 2017-01-01 DIAGNOSIS — R079 Chest pain, unspecified: Secondary | ICD-10-CM

## 2017-01-01 DIAGNOSIS — Z7901 Long term (current) use of anticoagulants: Secondary | ICD-10-CM | POA: Insufficient documentation

## 2017-01-01 DIAGNOSIS — R030 Elevated blood-pressure reading, without diagnosis of hypertension: Secondary | ICD-10-CM | POA: Insufficient documentation

## 2017-01-01 DIAGNOSIS — F17210 Nicotine dependence, cigarettes, uncomplicated: Secondary | ICD-10-CM | POA: Insufficient documentation

## 2017-01-01 DIAGNOSIS — R0602 Shortness of breath: Secondary | ICD-10-CM

## 2017-01-01 DIAGNOSIS — I10 Essential (primary) hypertension: Secondary | ICD-10-CM

## 2017-01-01 DIAGNOSIS — I480 Paroxysmal atrial fibrillation: Secondary | ICD-10-CM | POA: Insufficient documentation

## 2017-01-01 MED ORDER — RIVAROXABAN 20 MG PO TABS
20.0000 mg | ORAL_TABLET | Freq: Once | ORAL | Status: DC
  Filled 2017-01-01: qty 1

## 2017-01-01 MED ORDER — RIVAROXABAN 20 MG PO TABS: 20.0000 mg | ORAL_TABLET | Freq: Every day | ORAL | 0 refills | Status: DC

## 2017-01-01 MED ORDER — CARVEDILOL 25 MG PO TABS: 25.0000 mg | ORAL_TABLET | Freq: Two times a day (BID) | ORAL | 0 refills | Status: DC

## 2017-01-01 MED ORDER — RIVAROXABAN (XARELTO) EDUCATION KIT FOR AFIB PATIENTS
PACK | Freq: Once | Status: DC
  Filled 2017-01-01: qty 1

## 2017-01-01 MED ORDER — DILTIAZEM HCL ER BEADS 300 MG PO CP24: 300.0000 mg | ORAL_CAPSULE | Freq: Every day | ORAL | 0 refills | Status: DC

## 2017-01-01 MED FILL — CARVEDILOL 25 MG TABLET: 25 | 30 days supply | Qty: 60 | Fill #0

## 2017-01-01 MED FILL — XARELTO 20 MG TABLET: 20 | 30 days supply | Qty: 30 | Fill #0

## 2017-01-01 NOTE — ED Notes (Signed)
ED Provider at bedside. 

## 2017-01-01 NOTE — Discharge Instructions (Signed)
Call the hotline number.  The social worker should also get in touch with you for

## 2017-01-01 NOTE — Care Management Note (Signed)
Case Management Note  CM consulted for no pcp and no ins.  Spoke with pt over the phone, [phone] who stated he use to see Dr. Thea SilversmithMackenzie but since he lost insurance he finally stopped going and paying out of pocket.  The same happened for his medications.  Pt was started on xeralto today and stated he was able to pick up all of his medications for $9.00.  Pt and CM discussed the 3 clinic options and [facility]  CM attempted to make an appointment for him but office was closed today.  CM will continue to attempt to make appointment for him over the next several days and advised pt of the plan.  CM also discussed [facility] needs and to call to make a financial counseling appointment.  Pt wrote all of the information down, including name and address of both locations.  Dala DockAdvised Jane, [facility] CM of pt's new need for xeralto and trouble with getting appointment for him to follow in addition to this CM.  CM will call pt with appointment time during the next time [facility] is open.

## 2017-01-01 NOTE — ED Notes (Signed)
Patient to receive medication and education kit through [facility].  [facility] only stocks 7m tablets.

## 2017-01-01 NOTE — ED Provider Notes (Signed)
[facility] [facility] Provider Note   CSN: [id number] Arrival date & time: 01/01/17  1311     History   Chief Complaint Chief Complaint  Patient presents with  . Hypertension    HPI Raymond Short is a 46 y.o. male.  46 yo M with a chief complaint of asymptomatic hypertension. He has been off of his medications for the past 5 or 6 months due to issues being able afford it. He went to see a dentist today and the were concerned about how high his blood pressure was. He called his family physician and he did not have symptoms told to come to the ED.   The history is provided by the patient.  Hypertension  This is a new problem. The current episode started less than 1 hour ago. The problem occurs constantly. The problem has not changed since onset.Pertinent negatives include no chest pain, no abdominal pain, no headaches and no shortness of breath. Nothing aggravates the symptoms. Nothing relieves the symptoms. He has tried nothing for the symptoms. The treatment provided no relief.  Illness  Pertinent negatives include no chest pain, no abdominal pain, no headaches and no shortness of breath.    Past Medical History:  Diagnosis Date  . Hypertension   . Hypertriglyceridemia 07/24/2015  . Paroxysmal atrial fibrillation (HCC)   . Polysubstance abuse    History of EtOH, Cocaine    Patient Active Problem List   Diagnosis Date Noted  . Hypertriglyceridemia 07/24/2015  . Abnormal EKG 09/13/2011  . HTN (hypertension) 09/13/2011  . Leukocytosis 09/13/2011  . Abdominal pain - with Nausea & emesis 09/13/2011    Class: Acute  . Dehydration 09/13/2011  . Rhabdomyolysis 09/13/2011  . Left ventricular diastolic dysfunction, NYHA class 2 09/13/2011  . Polysubstance abuse 08/01/2011  . Atrial fibrillation with RVR ([place]) 08/01/2011  . Acute renal failure ([place]) 08/01/2011    Past Surgical History:  Procedure Laterality Date  . CARDIOVERSION    . WISDOM TOOTH EXTRACTION          Home Medications    Prior to Admission medications   Medication Sig Start Date End Date Taking? Authorizing Provider  azelastine (ASTELIN) 0.1 % nasal spray Place 2 sprays into both nostrils daily. Use in each nostril as directed    [provider]  buPROPion (WELLBUTRIN XL) 300 MG 24 hr tablet Take 300 mg by mouth daily.    [provider]  carvedilol (COREG) 25 MG tablet Take 1 tablet (25 mg total) by mouth 2 (two) times daily. 01/01/17   Deno Etienne, DO  cyclobenzaprine (FLEXERIL) 5 MG tablet 1-2 tabs po every 8 hours as needed for muscle pain/spasm 05/31/15   Le, Thao P, DO  diltiazem (TIAZAC) 300 MG 24 hr capsule Take 1 capsule (300 mg total) by mouth daily. 01/01/17   Deno Etienne, DO  fenofibrate 160 MG tablet Take 160 mg by mouth daily.    [provider]  ibuprofen (ADVIL,MOTRIN) 600 MG tablet Take 600 mg by mouth every 6 (six) hours as needed.    [provider]  levocetirizine (XYZAL) 5 MG tablet Take 5 mg by mouth every evening.    [provider]  Multiple Vitamin (MULITIVITAMIN WITH MINERALS) TABS Take 1 tablet by mouth daily. 08/01/11   Brett Canales, PA-C  Omega-3-acid Ethyl Esters (LOVAZA PO) Take 1 capsule by mouth daily. Reported on 05/31/2015    [provider]  rivaroxaban (XARELTO) 20 MG TABS tablet Take 1 tablet (20  mg total) by mouth daily with supper. 01/01/17   Deno Etienne, DO    Family History Family History  Problem Relation Age of Onset  . Cirrhosis Father   . Heart failure Mother   . Stroke Maternal Grandfather   . Prostate cancer Maternal Grandfather     Social History Social History  Substance Use Topics  . Smoking status: Current Every Day Smoker    Packs/day: 1.00    Years: 24.00    Types: Cigarettes  . Smokeless tobacco: Never Used  . Alcohol use 4.8 oz/week    8 Cans of beer per week     Comment: 2-3 x week     Allergies   Patient has no known allergies.   Review of Systems Review  of Systems  Constitutional: Negative for chills and fever.  HENT: Negative for congestion and facial swelling.   Eyes: Negative for discharge and visual disturbance.  Respiratory: Negative for shortness of breath.   Cardiovascular: Negative for chest pain and palpitations.  Gastrointestinal: Negative for abdominal pain, diarrhea and vomiting.  Musculoskeletal: Negative for arthralgias and myalgias.  Skin: Negative for color change and rash.  Neurological: Negative for tremors, syncope and headaches.  Psychiatric/Behavioral: Negative for confusion and dysphoric mood.     Physical Exam Updated Vital Signs BP (!) 146/101 (BP Location: Right Arm) Comment: Simultaneous filing. User may not have seen previous data.  Pulse 82 Comment: Simultaneous filing. User may not have seen previous data.  Temp 98.4 F (36.9 C) (Oral)   Resp 11 Comment: Simultaneous filing. User may not have seen previous data.  Ht 5' 11" (1.803 m)   Wt 93 kg (205 lb)   SpO2 98% Comment: Simultaneous filing. User may not have seen previous data.  BMI 28.59 kg/m   Physical Exam  Constitutional: He is oriented to person, place, and time. He appears well-developed and well-nourished.  HENT:  Head: Normocephalic and atraumatic.  Eyes: Pupils are equal, round, and reactive to light. EOM are normal.  Neck: Normal range of motion. Neck supple. No JVD present.  Cardiovascular: Normal rate and regular rhythm.  Exam reveals no gallop and no friction rub.   No murmur heard. Pulmonary/Chest: No respiratory distress. He has no wheezes.  Abdominal: He exhibits no distension. There is no rebound and no guarding.  Musculoskeletal: Normal range of motion.  Neurological: He is alert and oriented to person, place, and time. He has normal strength. No cranial nerve deficit or sensory deficit. He displays a negative Romberg sign. Coordination and gait normal. GCS eye subscore is 4. GCS verbal subscore is 5. GCS motor subscore is 6.   Benign neuro exam  Skin: No rash noted. No pallor.  Psychiatric: He has a normal mood and affect. His behavior is normal.  Nursing note and vitals reviewed.    ED Treatments / Results  Labs (all labs ordered are listed, but only abnormal results are displayed) Labs Reviewed - No data to display  EKG  EKG Interpretation None       Radiology No results found.  Procedures Procedures (including critical care time)  Medications Ordered in ED Medications  rivaroxaban (XARELTO) Education Kit for Afib patients (not administered)  rivaroxaban (XARELTO) tablet 20 mg (not administered)     Initial Impression / Assessment and Plan / ED Course  I have reviewed the triage vital signs and the nursing notes.  Pertinent labs & imaging results that were available during my care of the patient were reviewed by me  and considered in my medical decision making (see chart for details).      46 year old male with asymptomatic hypertension. He has been without his medications. Contacted case management to help with assistance. Told to call the minor physician hotline. Switched his anticoagulation to Xarelto will give him a month trial pack.  CHADS2VASC of 2.  Sent to afib clinic.   Patient asymptomatic with no noted s/s of end organ damage.  No chest pain, diaphoresis, nausea or other acs symptoms.  No headache or neurologic complaints,  no unequal pulses, normal pulse ox without rales or sob.  Feel this is unlikely to be a Hypertensive Emergency and recent studies suggest no benefit for inpatient admission.  There are also no studies to my knowledge suggesting that patients with hypertensive urgency have increased risk for end organ disease.The patient will follow up closely with their PCP.  Compliance with their medication stressed.    Lowell Guitar, Cicero Duck EH, et al. Characteristics and outcomes of patients presenting with hypertensive urgency in the office setting. JAMA Intern Med.  2016 Jul 1; 176(7): 981-8.    Final Clinical Impressions(s) / ED Diagnoses   Final diagnoses:  Asymptomatic hypertension  Paroxysmal A-fib ([place])    New Prescriptions Discharge Medication List as of 01/01/2017  2:05 PM    START taking these medications   Details  rivaroxaban (XARELTO) 20 MG TABS tablet Take 1 tablet (20 mg total) by mouth daily with supper., Starting Wed 01/01/2017, Print         [place], [place], DO 01/01/17 1518

## 2017-01-01 NOTE — ED Triage Notes (Signed)
Pt reports he was at the dentist this am and bp was high. States he has been off his meds for 3-4 months due to lack of health imsurance

## 2017-01-01 NOTE — ED Notes (Signed)
Pt on monitor 

## 2017-01-02 ENCOUNTER — Telehealth: Payer: Self-pay | Admitting: Emergency Medicine

## 2017-01-02 NOTE — Telephone Encounter (Signed)
CM advised pt of appointment time for Mon, Oct 1 at 13:00 for [facility]. Additionally reminded pt [facility] before then.  No further CM needs noted at this time.

## 2017-01-07 ENCOUNTER — Telehealth: Payer: Self-pay

## 2017-01-07 NOTE — Telephone Encounter (Signed)
Message received from Derrek Monaco, RN CM requesting a hospital follow up appointment at [facility] for the patient.  There are currently no appointments available at [facility] and he has an appointment scheduled at the [facility] for 01/20/17.  He can still utilize the services at [facility] - pharmacy, social work, Editor, commissioning.

## 2017-01-20 ENCOUNTER — Ambulatory Visit: Payer: 59 | Admitting: Family Medicine

## 2017-03-24 ENCOUNTER — Ambulatory Visit: Payer: 59 | Admitting: Family Medicine

## 2018-07-24 ENCOUNTER — Telehealth: Payer: 59 | Admitting: Nurse Practitioner

## 2018-07-24 DIAGNOSIS — Z7189 Other specified counseling: Secondary | ICD-10-CM

## 2018-07-24 NOTE — Progress Notes (Signed)
E-Visit for Corona Virus Screening  Based on your current symptoms, it seems unlikely that your symptoms are related to the Coronavirus.   Coronavirus disease 2019 (COVID-19) is a respiratory illness that can spread from person to person. The virus that causes COVID-19 is a new virus that was first identified in the country of [place] but is now found in multiple other countries and has spread to the [place].  Symptoms associated with the virus are mild to severe fever, cough, and shortness of breath. There is currently no vaccine to protect against COVID-19, and there is no specific antiviral treatment for the virus.   To be considered HIGH RISK for Coronavirus (COVID-19), you have to meet the following criteria:  . Traveled to [place], [place], [place], [place] or [place]; or in the [place] to [place], [place], [place], or [place]; and have fever, cough, and shortness of breath within the last 2 weeks of travel OR  . Been in close contact with a person diagnosed with COVID-19 within the last 2 weeks and have fever, cough, and shortness of breath  . IF YOU DO NOT MEET THESE CRITERIA, YOU ARE CONSIDERED LOW RISK FOR COVID-19.   It is vitally important that if you feel that you have an infection such as this virus or any other virus that you stay home and away from places where you may spread it to others.  You should self-quarantine for 14 days if you have symptoms that could potentially be coronavirus and avoid contact with people age 65 and older.   You can use medication such as delsym or mucinex OTC for cough  You may also take acetaminophen (Tylenol) as needed for fever.   Reduce your risk of any infection by using the same precautions used for avoiding the common cold or flu:  . Wash your hands often with soap and warm water for at least 20 seconds.  If soap and water are not readily available, use an alcohol-based hand sanitizer with at least 60% alcohol.  . If coughing or  sneezing, cover your mouth and nose by coughing or sneezing into the elbow areas of your shirt or coat, into a tissue or into your sleeve (not your hands). . Avoid shaking hands with others and consider head nods or verbal greetings only. . Avoid touching your eyes, nose, or mouth with unwashed hands.  . Avoid close contact with people who are sick. . Avoid places or events with large numbers of people in one location, like concerts or sporting events. . Carefully consider travel plans you have or are making. . If you are planning any travel outside or inside the [place], visit the CDC's Travelers' Health webpage for the latest health notices. . If you have some symptoms but not all symptoms, continue to monitor at home and seek medical attention if your symptoms worsen. . If you are having a medical emergency, call 911.  HOME CARE . Only take medications as instructed by your medical team. . Drink plenty of fluids and get plenty of rest. . A steam or ultrasonic humidifier can help if you have congestion.   GET HELP RIGHT AWAY IF: . You develop worsening fever. . You become short of breath . You cough up blood. . Your symptoms become more severe MAKE SURE YOU   Understand these instructions.  Will watch your condition.  Will get help right away if you are not doing well or get worse.  Your e-visit answers   were reviewed by a board certified advanced clinical practitioner to complete your personal care plan.  Depending on the condition, your plan could have included both over the counter or prescription medications.  If there is a problem please reply once you have received a response from your provider. Your safety is important to us.  If you have drug allergies check your prescription carefully.    You can use MyChart to ask questions about today's visit, request a non-urgent call back, or ask for a work or school excuse for 24 hours related to this e-Visit. If it has been greater than 24  hours you will need to follow up with your provider, or enter a new e-Visit to address those concerns. You will get an e-mail in the next two days asking about your experience.  I hope that your e-visit has been valuable and will speed your recovery. Thank you for using e-visits.   5 minutes spent reviewing and documenting in chart.  

## 2018-08-17 ENCOUNTER — Ambulatory Visit: Payer: 59 | Admitting: Family Medicine

## 2018-08-27 ENCOUNTER — Ambulatory Visit: Payer: 59 | Admitting: Cardiology

## 2019-01-04 ENCOUNTER — Ambulatory Visit (INDEPENDENT_AMBULATORY_CARE_PROVIDER_SITE_OTHER): Payer: Self-pay | Admitting: Family Medicine

## 2019-01-04 ENCOUNTER — Other Ambulatory Visit: Payer: Self-pay

## 2019-01-04 ENCOUNTER — Encounter: Payer: Self-pay | Admitting: Family Medicine

## 2019-01-04 VITALS — BP 145/86 | HR 73 | Temp 98.8°F | Resp 14 | Wt 224.4 lb

## 2019-01-04 DIAGNOSIS — R739 Hyperglycemia, unspecified: Secondary | ICD-10-CM

## 2019-01-04 LAB — POCT GLYCOSYLATED HEMOGLOBIN (HGB A1C): Hemoglobin A1C: 6.1 % — AB (ref 4.0–5.6)

## 2019-01-04 LAB — GLUCOSE, POCT (MANUAL RESULT ENTRY): POC Glucose: 100 mg/dl — AB (ref 70–99)

## 2019-01-04 NOTE — Progress Notes (Signed)
Subjective:    Patient ID: Raymond Short, male    DOB: August 08, 1970, 48 y.o.   MRN: [id number]  HPI Raymond Short is a 48 y.o. male Presents today for: Chief Complaint  Patient presents with  . hemoglobin check    Patient is here due to he went to the Urgent care on 01/04/19 and his Hemoglbin was 6.5 and was told he needed to come here to get this reched and if hemo is in normal range then need send a note stating it is in exceptable range on on some kind of med for this elevated condition. on 09/16/17 Hem was 5.70 yday 01/03/19 it was 6.5.Patient do not have a pcp right   Hypertension: BP Readings from Last 3 Encounters:  01/04/19 (!) 145/86  01/01/17 (!) 146/101  06/23/16 (!) 157/107   Lab Results  Component Value Date   CREATININE 1.32 06/22/2015  diltiazem 300mg  qd, losartan 50mg  qd.  History of paroxysmal Afib.  No recent palpitations.   DOT exam in June - reading 133/76. A1c at [facility] yesterday for DOT physical, new employer needed new exam - had glycosuria. Had A1c 6.5. test in May was 5.7.  Some diet change, at home with quarantine - working from home. Usually more active than recently with pandemic. No increased thirst/polyuria/blurry vision.   Needs letter regarding lab results and treatment plan for DOT physical   No FH of Diabetes.  Supplements: MVI.  Soda/sweet tea - 2-3 times per week.  Fast food: rare.     Patient Active Problem List   Diagnosis Date Noted  . Hypertriglyceridemia 07/24/2015  . Abnormal EKG 09/13/2011  . HTN (hypertension) 09/13/2011  . Leukocytosis 09/13/2011  . Abdominal pain - with Nausea & emesis 09/13/2011    Class: Acute  . Dehydration 09/13/2011  . Rhabdomyolysis 09/13/2011  . Left ventricular diastolic dysfunction, NYHA class 2 09/13/2011  . Polysubstance abuse ([place]) 08/01/2011  . Atrial fibrillation with RVR ([place]) 08/01/2011  . Acute renal failure ([place]) 08/01/2011   Past Medical History:  Diagnosis Date  .  Hypertension   . Hypertriglyceridemia 07/24/2015  . Paroxysmal atrial fibrillation ([facility])   . Polysubstance abuse    History of EtOH, Cocaine   Past Surgical History:  Procedure Laterality Date  . CARDIOVERSION    . WISDOM TOOTH EXTRACTION     No Known Allergies Prior to Admission medications   Medication Sig Start Date End Date Taking? Authorizing Provider  diltiazem (TIADYLT ER) 300 MG 24 hr capsule Take 300 mg by mouth daily.   Yes [provider]  losartan (COZAAR) 50 MG tablet Take 50 mg by mouth daily.   Yes [provider]   Social History   Socioeconomic History  . Marital status: Legally Separated    Spouse name: Not on file  . Number of children: Not on file  . Years of education: Not on file  . Highest education level: Not on file  Occupational History  . Not on file  Social Needs  . Financial resource strain: Not on file  . Food insecurity    Worry: Not on file    Inability: Not on file  . Transportation needs    Medical: Not on file    Non-medical: Not on file  Tobacco Use  . Smoking status: Current Every Day Smoker    Packs/day: 1.00    Years: 24.00    Pack years: 24.00    Types: Cigarettes  . Smokeless tobacco: Never Used  Substance and Sexual Activity  . Alcohol use: Yes    Alcohol/week: 8.0 standard drinks    Types: 8 Cans of beer per week    Comment: 2-3 x week  . Drug use: Yes    Types: Cocaine    Comment: Denies recent drug use  . Sexual activity: Not on file  Lifestyle  . Physical activity    Days per week: Not on file    Minutes per session: Not on file  . Stress: Not on file  Relationships  . Social Musicianconnections    Talks on phone: Not on file    Gets together: Not on file    Attends religious service: Not on file    Active member of club or organization: Not on file    Attends meetings of clubs or organizations: Not on file    Relationship status: Not on file  . Intimate partner violence    Fear of current or ex  partner: Not on file    Emotionally abused: Not on file    Physically abused: Not on file    Forced sexual activity: Not on file  Other Topics Concern  . Not on file  Social History Narrative  . Not on file    Review of Systems  Constitutional: Negative for fatigue and unexpected weight change.  Eyes: Negative for visual disturbance.  Respiratory: Negative for cough, chest tightness and shortness of breath.   Cardiovascular: Negative for chest pain, palpitations and leg swelling.  Gastrointestinal: Negative for abdominal pain and blood in stool.  Neurological: Negative for dizziness, light-headedness and headaches.       Objective:   Physical Exam Vitals signs reviewed.  Constitutional:      Appearance: He is well-developed.  HENT:     Head: Normocephalic and atraumatic.  Eyes:     Pupils: Pupils are equal, round, and reactive to light.  Neck:     Vascular: No carotid bruit or JVD.  Cardiovascular:     Rate and Rhythm: Normal rate and regular rhythm.     Heart sounds: Normal heart sounds. No murmur.  Pulmonary:     Effort: Pulmonary effort is normal.     Breath sounds: Normal breath sounds. No rales.  Skin:    General: Skin is warm and dry.  Neurological:     Mental Status: He is alert and oriented to person, place, and time.    Vitals:   01/04/19 1412 01/04/19 1422  BP: (!) 154/91 (!) 145/86  Pulse: 73   Resp: 14   Temp: 98.8 F (37.1 C)   TempSrc: Oral   SpO2: 99%   Weight: 224 lb 6.4 oz (101.8 kg)      Results for orders placed or performed in visit on 01/04/19  POCT glycosylated hemoglobin (Hb A1C)  Result Value Ref Range   Hemoglobin A1C 6.1 (A) 4.0 - 5.6 %   HbA1c POC (<> result, manual entry)     HbA1c, POC (prediabetic range)     HbA1c, POC (controlled diabetic range)    POCT glucose (manual entry)  Result Value Ref Range   POC Glucose 100 (A) 70 - 99 mg/dl        Assessment & Plan:   Raymond FortsMichael A Short is a 48 y.o. male Hyperglycemia -  Plan: POCT glycosylated hemoglobin (Hb A1C), POCT glucose (manual entry), CANCELED: Hemoglobin A1c Asymptomatic, A1c in office at prediabetes level.  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  Diet/exercise discussed with repeat testing within 6  months.  RTC precautions and typical symptoms of hypoglycemia discussed.  No orders of the defined types were placed in this encounter.  Patient Instructions   See info on prediabetes. Increased activity, watching diet should help. Recheck levels in 6 months.  Return to the clinic or go to the nearest emergency room if any of your symptoms worsen or new symptoms occur.   Prediabetes Prediabetes is the condition of having a blood sugar (blood glucose) level that is higher than it should be, but not high enough for you to be diagnosed with type 2 diabetes. Having prediabetes puts you at risk for developing type 2 diabetes (type 2 diabetes mellitus). Prediabetes may be called impaired glucose tolerance or impaired fasting glucose. Prediabetes usually does not cause symptoms. Your health care provider can diagnose this condition with blood tests. You may be tested for prediabetes if you are overweight and if you have at least one other risk factor for prediabetes. What is blood glucose, and how is it measured? Blood glucose refers to the amount of glucose in your bloodstream. Glucose comes from eating foods that contain sugars and starches (carbohydrates), which the body breaks down into glucose. Your blood glucose level may be measured in mg/dL (milligrams per deciliter) or mmol/L (millimoles per liter). Your blood glucose may be checked with one or more of the following blood tests:  A fasting blood glucose (FBG) test. You will not be allowed to eat (you will fast) for 8 hours or longer before a blood sample is taken. ? A normal range for FBG is 70-100 mg/dl (2.9-5.2 mmol/L).  An A1c (hemoglobin A1c) blood test. This test provides information about blood  glucose control over the previous 2?56months.  An oral glucose tolerance test (OGTT). This test measures your blood glucose at two times: ? After fasting. This is your baseline level. ? Two hours after you drink a beverage that contains glucose. You may be diagnosed with prediabetes:  If your FBG is 100?125 mg/dL (8.4-1.3 mmol/L).  If your A1c level is 5.7?6.4%.  If your OGTT result is 140?199 mg/dL (2.4-40 mmol/L). These blood tests may be repeated to confirm your diagnosis. How can this condition affect me? The pancreas produces a hormone (insulin) that helps to move glucose from the bloodstream into cells. When cells in the body do not respond properly to insulin that the body makes (insulin resistance), excess glucose builds up in the blood instead of going into cells. As a result, high blood glucose (hyperglycemia) can develop, which can cause many complications. Hyperglycemia is a symptom of prediabetes. Having high blood glucose for a long time is dangerous. Too much glucose in your blood can damage your nerves and blood vessels. Long-term damage can lead to complications from diabetes, which may include:  Heart disease.  Stroke.  Blindness.  Kidney disease.  Depression.  Poor circulation in the feet and legs, which could lead to surgical removal (amputation) in severe cases. What can increase my risk? Risk factors for prediabetes include:  Having a family member with type 2 diabetes.  Being overweight or obese.  Being older than age 14.  Being of American [place], African-American, Hispanic/Latino, or Asian/Pacific Islander descent.  Having an inactive (sedentary) lifestyle.  Having a history of heart disease.  History of gestational diabetes or polycystic ovary syndrome (PCOS), in women.  Having low levels of good cholesterol (HDL-C) or high levels of blood fats (triglycerides).  Having high blood pressure. What actions can I take to prevent diabetes?  Be physically active. ? Do moderate-intensity physical activity for 30 or more minutes on 5 or more days of the week, or as much as told by your health care provider. This could be brisk walking, biking, or water aerobics. ? Ask your health care provider what activities are safe for you. A mix of physical activities may be best, such as walking, swimming, cycling, and strength training.  Lose weight as told by your health care provider. ? Losing 5-7% of your body weight can reverse insulin resistance. ? Your health care provider can determine how much weight loss is best for you and can help you lose weight safely.  Follow a healthy meal plan. This includes eating lean proteins, complex carbohydrates, fresh fruits and vegetables, low-fat dairy products, and healthy fats. ? Follow instructions from your health care provider about eating or drinking restrictions. ? Make an appointment to see a diet and nutrition specialist (registered dietitian) to help you create a healthy eating plan that is right for you.  Do not smoke or use any tobacco products, such as cigarettes, chewing tobacco, and e-cigarettes. If you need help quitting, ask your health care provider.  Take over-the-counter and prescription medicines as told by your health care provider. You may be prescribed medicines that help lower the risk of type 2 diabetes.  Keep all follow-up visits as told by your health care provider. This is important. Summary  Prediabetes is the condition of having a blood sugar (blood glucose) level that is higher than it should be, but not high enough for you to be diagnosed with type 2 diabetes.  Having prediabetes puts you at risk for developing type 2 diabetes (type 2 diabetes mellitus).  To help prevent type 2 diabetes, make lifestyle changes such as being physically active and eating a healthy diet. Lose weight as told by your health care provider. This information is not intended to replace advice  given to you by your health care provider. Make sure you discuss any questions you have with your health care provider. Document Released: 07/31/2015 Document Revised: 07/31/2018 Document Reviewed: 05/30/2015 Elsevier Patient Education  [employer]2020 Else[employer] Inc.    If you have lab work done today you will be contacted with your lab results within the next 2 weeks.  If you have not heard from us then please contact us. The fastest way to get your results is to register for My Chart.   IF you received an x-ray today, you will receive an invoice from [facility]. Please contact [facility] at [phone] with questions or concerns regarding your invoice.   IF you received labwork today, you will receive an invoice from [facility]. Please contact [facility] at [phone] with questions or concerns regarding your invoice.   Our billing staff will not be able to assist you with questions regarding bills from these companies.  You will be contacted with the lab results as soon as they are available. The fastest way to get your results is to activate your My Chart account. Instructions are located on the last page of this paperwork. If you have not heard from us regarding the results in 2 weeks, please contact this office.       Signed,   Meredith StaggersJeffrey Jefry Lesinski, MD [facility].  01/04/19 3:20 PM

## 2019-01-04 NOTE — Patient Instructions (Addendum)
See info on prediabetes. Increased activity, watching diet should help. Recheck levels in 6 months.  Return to the clinic or go to the nearest emergency room if any of your symptoms worsen or new symptoms occur.   Prediabetes Prediabetes is the condition of having a blood sugar (blood glucose) level that is higher than it should be, but not high enough for you to be diagnosed with type 2 diabetes. Having prediabetes puts you at risk for developing type 2 diabetes (type 2 diabetes mellitus). Prediabetes may be called impaired glucose tolerance or impaired fasting glucose. Prediabetes usually does not cause symptoms. Your health care provider can diagnose this condition with blood tests. You may be tested for prediabetes if you are overweight and if you have at least one other risk factor for prediabetes. What is blood glucose, and how is it measured? Blood glucose refers to the amount of glucose in your bloodstream. Glucose comes from eating foods that contain sugars and starches (carbohydrates), which the body breaks down into glucose. Your blood glucose level may be measured in mg/dL (milligrams per deciliter) or mmol/L (millimoles per liter). Your blood glucose may be checked with one or more of the following blood tests:  A fasting blood glucose (FBG) test. You will not be allowed to eat (you will fast) for 8 hours or longer before a blood sample is taken. ? A normal range for FBG is 70-100 mg/dl (1.6-1.03.9-5.6 mmol/L).  An A1c (hemoglobin A1c) blood test. This test provides information about blood glucose control over the previous 2?3months.  An oral glucose tolerance test (OGTT). This test measures your blood glucose at two times: ? After fasting. This is your baseline level. ? Two hours after you drink a beverage that contains glucose. You may be diagnosed with prediabetes:  If your FBG is 100?125 mg/dL (9.6-0.45.6-6.9 mmol/L).  If your A1c level is 5.7?6.4%.  If your OGTT result is 140?199 mg/dL  (5.4-097.8-11 mmol/L). These blood tests may be repeated to confirm your diagnosis. How can this condition affect me? The pancreas produces a hormone (insulin) that helps to move glucose from the bloodstream into cells. When cells in the body do not respond properly to insulin that the body makes (insulin resistance), excess glucose builds up in the blood instead of going into cells. As a result, high blood glucose (hyperglycemia) can develop, which can cause many complications. Hyperglycemia is a symptom of prediabetes. Having high blood glucose for a long time is dangerous. Too much glucose in your blood can damage your nerves and blood vessels. Long-term damage can lead to complications from diabetes, which may include:  Heart disease.  Stroke.  Blindness.  Kidney disease.  Depression.  Poor circulation in the feet and legs, which could lead to surgical removal (amputation) in severe cases. What can increase my risk? Risk factors for prediabetes include:  Having a family member with type 2 diabetes.  Being overweight or obese.  Being older than age 50.  Being of American [place], African-American, Hispanic/Latino, or Asian/Pacific Islander descent.  Having an inactive (sedentary) lifestyle.  Having a history of heart disease.  History of gestational diabetes or polycystic ovary syndrome (PCOS), in women.  Having low levels of good cholesterol (HDL-C) or high levels of blood fats (triglycerides).  Having high blood pressure. What actions can I take to prevent diabetes?      Be physically active. ? Do moderate-intensity physical activity for 30 or more minutes on 5 or more days of the week, or as  much as told by your health care provider. This could be brisk walking, biking, or water aerobics. ? Ask your health care provider what activities are safe for you. A mix of physical activities may be best, such as walking, swimming, cycling, and strength training.  Lose weight as  told by your health care provider. ? Losing 5-7% of your body weight can reverse insulin resistance. ? Your health care provider can determine how much weight loss is best for you and can help you lose weight safely.  Follow a healthy meal plan. This includes eating lean proteins, complex carbohydrates, fresh fruits and vegetables, low-fat dairy products, and healthy fats. ? Follow instructions from your health care provider about eating or drinking restrictions. ? Make an appointment to see a diet and nutrition specialist (registered dietitian) to help you create a healthy eating plan that is right for you.  Do not smoke or use any tobacco products, such as cigarettes, chewing tobacco, and e-cigarettes. If you need help quitting, ask your health care provider.  Take over-the-counter and prescription medicines as told by your health care provider. You may be prescribed medicines that help lower the risk of type 2 diabetes.  Keep all follow-up visits as told by your health care provider. This is important. Summary  Prediabetes is the condition of having a blood sugar (blood glucose) level that is higher than it should be, but not high enough for you to be diagnosed with type 2 diabetes.  Having prediabetes puts you at risk for developing type 2 diabetes (type 2 diabetes mellitus).  To help prevent type 2 diabetes, make lifestyle changes such as being physically active and eating a healthy diet. Lose weight as told by your health care provider. This information is not intended to replace advice given to you by your health care provider. Make sure you discuss any questions you have with your health care provider. Document Released: 07/31/2015 Document Revised: 07/31/2018 Document Reviewed: 05/30/2015 Elsevier Patient Education  [employer].    If you have lab work done today you will be contacted with your lab results within the next 2 weeks.  If you have not heard from [place] then please  contact us. The fastest way to get your results is to register for My Chart.   IF you received an x-ray today, you will receive an invoice from [facility]. Please contact [facility] at [phone] with questions or concerns regarding your invoice.   IF you received labwork today, you will receive an invoice from [facility]. Please contact [facility] at [phone] with questions or concerns regarding your invoice.   Our billing staff will not be able to assist you with questions regarding bills from these companies.  You will be contacted with the lab results as soon as they are available. The fastest way to get your results is to activate your My Chart account. Instructions are located on the last page of this paperwork. If you have not heard from [place] regarding the results in 2 weeks, please contact this office.

## 2019-03-17 ENCOUNTER — Other Ambulatory Visit: Payer: Self-pay

## 2019-03-17 ENCOUNTER — Telehealth: Payer: Self-pay | Admitting: Family Medicine

## 2019-03-17 NOTE — Telephone Encounter (Signed)
Pt scheduled to have labs drawn before cpe but orders are not in. Nurse visit was scheduled to accommodate patient request on 12/7. Please advise

## 2019-03-29 ENCOUNTER — Ambulatory Visit: Payer: Self-pay

## 2019-03-31 ENCOUNTER — Ambulatory Visit: Payer: Self-pay

## 2019-04-01 ENCOUNTER — Encounter: Payer: Self-pay | Admitting: Family Medicine

## 2019-04-07 ENCOUNTER — Encounter: Payer: Self-pay | Admitting: Family Medicine

## 2019-08-16 ENCOUNTER — Ambulatory Visit: Payer: Self-pay | Admitting: Physician Assistant

## 2019-08-16 VITALS — BP 160/110 | HR 96 | Temp 98.7°F | Ht 71.0 in | Wt 210.0 lb

## 2019-08-16 DIAGNOSIS — I10 Essential (primary) hypertension: Secondary | ICD-10-CM

## 2019-08-16 DIAGNOSIS — Z598 Other problems related to housing and economic circumstances: Secondary | ICD-10-CM

## 2019-08-16 DIAGNOSIS — E781 Pure hyperglyceridemia: Secondary | ICD-10-CM

## 2019-08-16 DIAGNOSIS — I4891 Unspecified atrial fibrillation: Secondary | ICD-10-CM

## 2019-08-16 DIAGNOSIS — R7303 Prediabetes: Secondary | ICD-10-CM

## 2019-08-16 DIAGNOSIS — Z125 Encounter for screening for malignant neoplasm of prostate: Secondary | ICD-10-CM

## 2019-08-16 DIAGNOSIS — Z1159 Encounter for screening for other viral diseases: Secondary | ICD-10-CM

## 2019-08-16 DIAGNOSIS — F17200 Nicotine dependence, unspecified, uncomplicated: Secondary | ICD-10-CM

## 2019-08-16 DIAGNOSIS — I519 Heart disease, unspecified: Secondary | ICD-10-CM

## 2019-08-16 DIAGNOSIS — Z5989 Other problems related to housing and economic circumstances: Secondary | ICD-10-CM

## 2019-08-16 DIAGNOSIS — Z6829 Body mass index (BMI) 29.0-29.9, adult: Secondary | ICD-10-CM

## 2019-08-16 DIAGNOSIS — I480 Paroxysmal atrial fibrillation: Secondary | ICD-10-CM

## 2019-08-16 DIAGNOSIS — I5189 Other ill-defined heart diseases: Secondary | ICD-10-CM

## 2019-08-16 MED ORDER — RIVAROXABAN 20 MG PO TABS: 20.0000 mg | ORAL_TABLET | Freq: Every day | ORAL | 2 refills | Status: DC

## 2019-08-16 MED ORDER — METFORMIN HCL 500 MG PO TABS: 500.0000 mg | ORAL_TABLET | Freq: Every day | ORAL | 2 refills | Status: DC

## 2019-08-16 MED ORDER — LOSARTAN POTASSIUM 50 MG PO TABS
50.0000 mg | ORAL_TABLET | Freq: Every day | ORAL | 2 refills | Status: DC
Start: 2019-08-16 — End: 2021-02-21

## 2019-08-16 MED ORDER — TRUE METRIX PRO BLOOD GLUCOSE VI STRP: ORAL_STRIP | 12 refills | Status: DC

## 2019-08-16 MED ORDER — DILTIAZEM HCL ER BEADS 300 MG PO CP24: 300.0000 mg | ORAL_CAPSULE | Freq: Every day | ORAL | 2 refills | Status: DC

## 2019-08-16 MED ORDER — TRUE METRIX AIR GLUCOSE METER W/DEVICE KIT: 1.0000 [IU] | PACK | Freq: Two times a day (BID) | 0 refills | Status: DC

## 2019-08-16 MED ORDER — ACCU-CHEK SOFTCLIX LANCETS MISC: 12 refills | Status: DC

## 2019-08-16 MED FILL — LOSARTAN POTASSIUM 50 MG TA: 50 | 30 days supply | Qty: 30 | Fill #0

## 2019-08-16 MED FILL — METFORMIN HCL 500 MG TABS: 500 | 30 days supply | Qty: 30 | Fill #0

## 2019-08-16 MED FILL — TRUE METRIX TEST STRIP: 25 days supply | Qty: 100 | Fill #0

## 2019-08-16 MED FILL — TRUEplus LANCETS 28G MISC: 25 days supply | Qty: 100 | Fill #0

## 2019-08-16 MED FILL — !TRUE METRIX BLOOD GLUCOSE: 1 days supply | Qty: 1 | Fill #0

## 2019-08-16 MED FILL — XARELTO 20 MG TABLET: 20 | 30 days supply | Qty: 30 | Fill #0

## 2019-08-16 MED FILL — CARTIA XT 300 MG CAPSULE SA: 300 | 30 days supply | Qty: 30 | Fill #0

## 2019-08-16 NOTE — Progress Notes (Signed)
Ran out a couple of weeks  Out of insurance  Does not have cuff  Random headaches  cartia and cozaar  eliquis a fib  - more than a year  Hospitalized for a fib a few years     New Patient Office Visit  Subjective:  Patient ID: Raymond Short, male    DOB: 25-May-1970  Age: 49 y.o. MRN: [id number]  CC:  Chief Complaint  Patient presents with  . Hypertension    HPI CHANDON LAZCANO reports that he has been out of his blood pressure medication for the last couple of weeks.  States that his last office visit with his primary care provider was in September 2020, is unable to be seen there again due to lack of health insurance at this time.  Does not check blood pressure at home, denies any hypertensive symptoms besides occasional headache.  Patient endorses significant history of proximal atrial fibrillation, states that he has been hospitalized in the past due to this, states that he was started on Coumadin, but was unable to afford follow-up, was last given a prescription for Xarelto at an emergency room visit in September 2018, states that he has not taken this for the past year due to financial issues.  Patient has previously been told that he is borderline diabetic, does not follow a diabetic diet, enjoys sweetened green tea and chocolate for snacks.  Is a daily smoker    Past Medical History:  Diagnosis Date  . Hypertension   . Hypertriglyceridemia 07/24/2015  . Paroxysmal atrial fibrillation (HCC)   . Polysubstance abuse ([place])    History of EtOH, Cocaine    Past Surgical History:  Procedure Laterality Date  . CARDIOVERSION    . WISDOM TOOTH EXTRACTION      Family History  Problem Relation Age of Onset  . Cirrhosis Father   . Heart failure Mother   . Stroke Maternal Grandfather   . Prostate cancer Maternal Grandfather     Social History   Socioeconomic History  . Marital status: Legally Separated    Spouse name: Not on file  . Number of children: Not  on file  . Years of education: Not on file  . Highest education level: Not on file  Occupational History  . Not on file  Tobacco Use  . Smoking status: Current Every Day Smoker    Packs/day: 1.00    Years: 24.00    Pack years: 24.00    Types: Cigarettes  . Smokeless tobacco: Never Used  Substance and Sexual Activity  . Alcohol use: Yes    Alcohol/week: 8.0 standard drinks    Types: 8 Cans of beer per week    Comment: 2-3 x week  . Drug use: Yes    Types: Cocaine    Comment: Denies recent drug use  . Sexual activity: Yes  Other Topics Concern  . Not on file  Social History Narrative  . Not on file   Social Determinants of Health   Financial Resource Strain:   . Difficulty of Paying Living Expenses:   Food Insecurity:   . Worried About Charity fundraiser in the Last Year:   . Arboriculturist in the Last Year:   Transportation Needs:   . Film/video editor (Medical):   Marland Kitchen Lack of Transportation (Non-Medical):   Physical Activity:   . Days of Exercise per Week:   . Minutes of Exercise per Session:   Stress:   . Feeling of  Stress :   Social Connections:   . Frequency of Communication with Friends and Family:   . Frequency of Social Gatherings with Friends and Family:   . Attends Religious Services:   . Active Member of Clubs or Organizations:   . Attends Archivist Meetings:   Marland Kitchen Marital Status:   Intimate Partner Violence:   . Fear of Current or Ex-Partner:   . Emotionally Abused:   Marland Kitchen Physically Abused:   . Sexually Abused:     ROS Review of Systems  Constitutional: Negative.   HENT: Negative.   Eyes: Negative for visual disturbance.  Respiratory: Positive for chest tightness. Negative for shortness of breath.   Cardiovascular: Negative for chest pain and palpitations.  Gastrointestinal: Negative.   Endocrine: Positive for polyuria. Negative for polydipsia.  Genitourinary: Negative.   Musculoskeletal: Negative.   Skin: Negative.    Allergic/Immunologic: Negative.   Neurological: Positive for headaches. Negative for dizziness, syncope, speech difficulty, weakness and numbness.  Hematological: Negative.   Psychiatric/Behavioral: Negative.     Objective:   Today's Vitals: BP (!) 160/110 (BP Location: Left Arm, Patient Position: Sitting, Cuff Size: Large)   Pulse 96   Temp 98.7 F (37.1 C) (Oral)   Ht 5' 11"  (1.803 m)   Wt 210 lb (95.3 kg)   SpO2 98%   BMI 29.29 kg/m   Physical Exam Vitals and nursing note reviewed.  Constitutional:      Appearance: Normal appearance.  HENT:     Head: Normocephalic and atraumatic.     Right Ear: External ear normal.     Left Ear: External ear normal.     Nose: Nose normal.     Mouth/Throat:     Mouth: Mucous membranes are moist.     Pharynx: Oropharynx is clear.  Eyes:     Extraocular Movements: Extraocular movements intact.     Conjunctiva/sclera: Conjunctivae normal.     Pupils: Pupils are equal, round, and reactive to light.  Cardiovascular:     Rate and Rhythm: Normal rate and regular rhythm.     Pulses: Normal pulses.     Heart sounds: Normal heart sounds.  Pulmonary:     Effort: Pulmonary effort is normal.     Breath sounds: Normal breath sounds.  Abdominal:     General: Abdomen is flat. Bowel sounds are normal.     Palpations: Abdomen is soft.  Musculoskeletal:        General: Normal range of motion.     Cervical back: Normal range of motion and neck supple.  Skin:    General: Skin is warm and dry.  Neurological:     General: No focal deficit present.     Mental Status: He is alert and oriented to person, place, and time.  Psychiatric:        Mood and Affect: Mood normal.        Behavior: Behavior normal.        Thought Content: Thought content normal.        Judgment: Judgment normal.     Assessment & Plan:   Problem List Items Addressed This Visit      Cardiovascular and Mediastinum   HTN (hypertension) - Primary (Chronic)   Relevant  Medications   rivaroxaban (XARELTO) 20 MG TABS tablet   diltiazem (TIADYLT ER) 300 MG 24 hr capsule   losartan (COZAAR) 50 MG tablet   Other Relevant Orders   CBC with Differential/Platelet   Comp. Metabolic Panel (12)  TSH     Other   Hypertriglyceridemia   Relevant Medications   rivaroxaban (XARELTO) 20 MG TABS tablet   diltiazem (TIADYLT ER) 300 MG 24 hr capsule   losartan (COZAAR) 50 MG tablet   Other Relevant Orders   Lipid panel    Other Visit Diagnoses    Prediabetes       Relevant Medications   metFORMIN (GLUCOPHAGE) 500 MG tablet   Blood Glucose Monitoring Suppl (TRUE METRIX AIR GLUCOSE METER) w/Device KIT   Accu-Chek Softclix Lancets lancets   glucose blood (TRUE METRIX PRO BLOOD GLUCOSE) test strip   Other Relevant Orders   Vitamin D, 25-hydroxy   Paroxysmal atrial fibrillation with rapid ventricular response (HCC)       Relevant Medications   rivaroxaban (XARELTO) 20 MG TABS tablet   diltiazem (TIADYLT ER) 300 MG 24 hr capsule   losartan (COZAAR) 50 MG tablet   Uninsured       Encounter for hepatitis C virus screening test for high risk patient       Relevant Orders   Hepatitis c antibody (reflex)   Screening for malignant neoplasm of prostate       Relevant Orders   PSA   Tobacco use disorder       Adult BMI 29.0-29.9 kg/sq m          Outpatient Encounter Medications as of 08/16/2019  Medication Sig  . diltiazem (TIADYLT ER) 300 MG 24 hr capsule Take 1 capsule (300 mg total) by mouth daily.  Marland Kitchen losartan (COZAAR) 50 MG tablet Take 1 tablet (50 mg total) by mouth daily.  . [DISCONTINUED] diltiazem (TIADYLT ER) 300 MG 24 hr capsule Take 300 mg by mouth daily.  . [DISCONTINUED] losartan (COZAAR) 50 MG tablet Take 50 mg by mouth daily.  . Accu-Chek Softclix Lancets lancets Use as instructed  . Blood Glucose Monitoring Suppl (TRUE METRIX AIR GLUCOSE METER) w/Device KIT 1 Units by Does not apply route 2 (two) times daily at 8 am and 10 pm.  . glucose blood  (TRUE METRIX PRO BLOOD GLUCOSE) test strip Use as instructed  . metFORMIN (GLUCOPHAGE) 500 MG tablet Take 1 tablet (500 mg total) by mouth daily with breakfast.  . rivaroxaban (XARELTO) 20 MG TABS tablet Take 1 tablet (20 mg total) by mouth daily with supper.   No facility-administered encounter medications on file as of 08/16/2019.  1. Essential hypertension Resume medications, encouraged patient to check blood pressure on a daily basis, work on smoking cessation. - CBC with Differential/Platelet - Comp. Metabolic Panel (12) - TSH - diltiazem (TIADYLT ER) 300 MG 24 hr capsule; Take 1 capsule (300 mg total) by mouth daily.  Dispense: 30 capsule; Refill: 2 - losartan (COZAAR) 50 MG tablet; Take 1 tablet (50 mg total) by mouth daily.  Dispense: 30 tablet; Refill: 2  2. Hypertriglyceridemia Previous lipid panel in 2017 did show elevated cholesterol, will recheck and consider statin - Lipid panel  3. Prediabetes Gave patient education on Mediterranean diet, checking fasting blood sugars, keeping log and taking this to primary care office visit for review - Vitamin D, 25-hydroxy - metFORMIN (GLUCOPHAGE) 500 MG tablet; Take 1 tablet (500 mg total) by mouth daily with breakfast.  Dispense: 30 tablet; Refill: 2 - Blood Glucose Monitoring Suppl (TRUE METRIX AIR GLUCOSE METER) w/Device KIT; 1 Units by Does not apply route 2 (two) times daily at 8 am and 10 pm.  Dispense: 1 kit; Refill: 0 - Accu-Chek Softclix Lancets lancets; Use as  instructed  Dispense: 100 each; Refill: 12 - glucose blood (TRUE METRIX PRO BLOOD GLUCOSE) test strip; Use as instructed  Dispense: 100 each; Refill: 12  4. Paroxysmal atrial fibrillation with rapid ventricular response ([place]) Encourage patient to resume Xarelto, [facility] - rivaroxaban (XARELTO) 20 MG TABS tablet; Take 1 tablet (20 mg total) by mouth daily with supper.  Dispense: 30 tablet; Refill: 2  5.  Uninsured Patient was given information on [place] financial assistance, give financial assistance application packet, given appointment to establish primary care at patient care at [facility] on September 22, 2019  6. Encounter for hepatitis C virus screening test for high risk patient History of substance abuse, history of tattoos - Hepatitis c antibody (reflex)  7. Screening for malignant neoplasm of prostate  - PSA  8. Tobacco use disorder   9. Adult BMI 29.0-29.9 kg/sq m    I have reviewed the patient's medical history (PMH, PSH, Social History, Family History, Medications, and allergies) , and have been updated if relevant. I spent 40 minutes reviewing chart and  face to face time with patient.     Follow-up: Return in about 5 weeks (around 09/22/2019) for To establish PCP, At Patient Care.   Loraine Grip Mayers, PA-C

## 2019-08-16 NOTE — Progress Notes (Signed)
Patient has been out of BP medication for a few weeks. Patient comp

## 2019-08-16 NOTE — Patient Instructions (Signed)
Your A1c was 6.1, this is considered prediabetes, I encourage you to start Metformin 500 mg in the morning with breakfast, check your blood sugar when you are fasting in the morning, keep a log of this and take with you to your primary care provider's appointment.  I put information in this paperwork regarding Mediterranean style diet as we discussed in the clinic.  I refilled your blood pressure medications, if you are able I encourage you to get a blood pressure cuff and check your blood pressure on a daily basis, if you have any symptoms such as shortness of breath, chest pain, headache, difficulty speaking, I encourage you to report to the emergency room for prompt evaluation.  [facility], if you have difficulty affording these medications, please let them know.  Please let us know if there is anything else that we can do for you, we scheduled you an appointment to be seen at patient care at [facility] on September 22, 2019.  Take care,  Kennieth Rad, PA-C Physician Assistant [facility] [email or web address]   Mediterranean Diet A Mediterranean diet refers to food and lifestyle choices that are based on the traditions of countries located on the [place]. This way of eating has been shown to help prevent certain conditions and improve outcomes for people who have chronic diseases, like kidney disease and heart disease. What are tips for following this plan? Lifestyle  Cook and eat meals together with your family, when possible.  Drink enough fluid to keep your urine clear or pale yellow.  Be physically active every day. This includes: ? Aerobic exercise like running or swimming. ? Leisure activities like gardening, walking, or housework.  Get 7-8 hours of sleep each night.  If recommended by your health care provider, drink red wine in moderation. This  means 1 glass a day for nonpregnant women and 2 glasses a day for men. A glass of wine equals 5 oz (150 mL). Reading food labels   Check the serving size of packaged foods. For foods such as rice and pasta, the serving size refers to the amount of cooked product, not dry.  Check the total fat in packaged foods. Avoid foods that have saturated fat or trans fats.  Check the ingredients list for added sugars, such as corn syrup. Shopping  At the grocery store, buy most of your food from the areas near the walls of the store. This includes: ? Fresh fruits and vegetables (produce). ? Grains, beans, nuts, and seeds. Some of these may be available in unpackaged forms or large amounts (in bulk). ? Fresh seafood. ? Poultry and eggs. ? Low-fat dairy products.  Buy whole ingredients instead of prepackaged foods.  Buy fresh fruits and vegetables in-season from local farmers markets.  Buy frozen fruits and vegetables in resealable bags.  If you do not have access to quality fresh seafood, buy precooked frozen shrimp or canned fish, such as tuna, salmon, or sardines.  Buy small amounts of raw or cooked vegetables, salads, or olives from the deli or salad bar at your store.  Stock your pantry so you always have certain foods on hand, such as olive oil, canned tuna, canned tomatoes, rice, pasta, and beans. Cooking  Cook foods with extra-virgin olive oil instead of using butter or other vegetable oils.  Have meat as a side dish, and have vegetables or grains as your main dish. This means having meat in  small portions or adding small amounts of meat to foods like pasta or stew.  Use beans or vegetables instead of meat in common dishes like chili or lasagna.  Experiment with different cooking methods. Try roasting or broiling vegetables instead of steaming or sauteing them.  Add frozen vegetables to soups, stews, pasta, or rice.  Add nuts or seeds for added healthy fat at each meal. You can  add these to yogurt, salads, or vegetable dishes.  Marinate fish or vegetables using olive oil, lemon juice, garlic, and fresh herbs. Meal planning   Plan to eat 1 vegetarian meal one day each week. Try to work up to 2 vegetarian meals, if possible.  Eat seafood 2 or more times a week.  Have healthy snacks readily available, such as: ? Vegetable sticks with hummus. ? [place] yogurt. ? Fruit and nut trail mix.  Eat balanced meals throughout the week. This includes: ? Fruit: 2-3 servings a day ? Vegetables: 4-5 servings a day ? Low-fat dairy: 2 servings a day ? Fish, poultry, or lean meat: 1 serving a day ? Beans and legumes: 2 or more servings a week ? Nuts and seeds: 1-2 servings a day ? Whole grains: 6-8 servings a day ? Extra-virgin olive oil: 3-4 servings a day  Limit red meat and sweets to only a few servings a month What are my food choices?  Mediterranean diet ? Recommended  Grains: Whole-grain pasta. Brown rice. Bulgar wheat. Polenta. Couscous. Whole-wheat bread. Orpah Cobb.  Vegetables: Artichokes. Beets. Broccoli. Cabbage. Carrots. Eggplant. Green beans. Chard. Kale. Spinach. Onions. Leeks. Peas. Squash. Tomatoes. Peppers. Radishes.  Fruits: Apples. Apricots. Avocado. Berries. Bananas. Cherries. Dates. Figs. Grapes. Lemons. Melon. Oranges. Peaches. Plums. Pomegranate.  Meats and other protein foods: Beans. Almonds. Sunflower seeds. Pine nuts. Peanuts. Cod. Salmon. Scallops. Shrimp. Tuna. Tilapia. Clams. Oysters. Eggs.  Dairy: Low-fat milk. Cheese. Greek yogurt.  Beverages: Water. Red wine. Herbal tea.  Fats and oils: Extra virgin olive oil. Avocado oil. Grape seed oil.  Sweets and desserts: [place] yogurt with honey. Baked apples. Poached pears. Trail mix.  Seasoning and other foods: Basil. Cilantro. Coriander. Cumin. Mint. Parsley. Sage. Rosemary. Tarragon. Garlic. Oregano. Thyme. Pepper. Balsalmic vinegar. Tahini. Hummus. Tomato sauce. Olives. Mushrooms. ?  Limit these  Grains: Prepackaged pasta or rice dishes. Prepackaged cereal with added sugar.  Vegetables: Deep fried potatoes (french fries).  Fruits: Fruit canned in syrup.  Meats and other protein foods: Beef. Pork. Lamb. Poultry with skin. Hot dogs. Tomasa Blase.  Dairy: Ice cream. Sour cream. Whole milk.  Beverages: Juice. Sugar-sweetened soft drinks. Beer. Liquor and spirits.  Fats and oils: Butter. Canola oil. Vegetable oil. Beef fat (tallow). Lard.  Sweets and desserts: Cookies. Cakes. Pies. Candy.  Seasoning and other foods: Mayonnaise. Premade sauces and marinades. The items listed may not be a complete list. Talk with your dietitian about what dietary choices are right for you. Summary  The Mediterranean diet includes both food and lifestyle choices.  Eat a variety of fresh fruits and vegetables, beans, nuts, seeds, and whole grains.  Limit the amount of red meat and sweets that you eat.  Talk with your health care provider about whether it is safe for you to drink red wine in moderation. This means 1 glass a day for nonpregnant women and 2 glasses a day for men. A glass of wine equals 5 oz (150 mL). This information is not intended to replace advice given to you by your health care provider. Make sure you discuss any  questions you have with your health care provider. Document Revised: 12/07/2015 Document Reviewed: 11/30/2015 Elsevier Patient Education  2020 Elsevier Inc.  Preventing Type 2 Diabetes Mellitus Type 2 diabetes (type 2 diabetes mellitus) is a long-term (chronic) disease that affects blood sugar (glucose) levels. Normally, a hormone called insulin allows glucose to enter cells in the body. The cells use glucose for energy. In type 2 diabetes, one or both of these problems may be present:  The body does not make enough insulin.  The body does not respond properly to insulin that it makes (insulin resistance). Insulin resistance or lack of insulin causes excess  glucose to build up in the blood instead of going into cells. As a result, high blood glucose (hyperglycemia) develops, which can cause many complications. Being overweight or obese and having an inactive (sedentary) lifestyle can increase your risk for diabetes. Type 2 diabetes can be delayed or prevented by making certain nutrition and lifestyle changes. What nutrition changes can be made?   Eat healthy meals and snacks regularly. Keep a healthy snack with you for when you get hungry between meals, such as fruit or a handful of nuts.  Eat lean meats and proteins that are low in saturated fats, such as chicken, fish, egg whites, and beans. Avoid processed meats.  Eat plenty of fruits and vegetables and plenty of grains that have not been processed (whole grains). It is recommended that you eat: ? 1?2 cups of fruit every day. ? 2?3 cups of vegetables every day. ? 6?8 oz of whole grains every day, such as oats, whole wheat, bulgur, brown rice, quinoa, and millet.  Eat low-fat dairy products, such as milk, yogurt, and cheese.  Eat foods that contain healthy fats, such as nuts, avocado, olive oil, and canola oil.  Drink water throughout the day. Avoid drinks that contain added sugar, such as soda or sweet tea.  Follow instructions from your health care provider about specific eating or drinking restrictions.  Control how much food you eat at a time (portion size). ? Check food labels to find out the serving sizes of foods. ? Use a kitchen scale to weigh amounts of foods.  Saute or steam food instead of frying it. Cook with water or broth instead of oils or butter.  Limit your intake of: ? Salt (sodium). Have no more than 1 tsp (2,400 mg) of sodium a day. If you have heart disease or high blood pressure, have less than ? tsp (1,500 mg) of sodium a day. ? Saturated fat. This is fat that is solid at room temperature, such as butter or fat on meat. What lifestyle changes can be made?  Activity   Do moderate-intensity physical activity for at least 30 minutes on at least 5 days of the week, or as much as told by your health care provider.  Ask your health care provider what activities are safe for you. A mix of physical activities may be best, such as walking, swimming, cycling, and strength training.  Try to add physical activity into your day. For example: ? Park in spots that are farther away than usual, so that you walk more. For example, park in a far corner of the parking lot when you go to the office or the grocery store. ? Take a walk during your lunch break. ? Use stairs instead of elevators or escalators. Weight Loss  Lose weight as directed. Your health care provider can determine how much weight loss is best for you and  can help you lose weight safely.  If you are overweight or obese, you may be instructed to lose at least 5?7 % of your body weight. Alcohol and Tobacco   Limit alcohol intake to no more than 1 drink a day for nonpregnant women and 2 drinks a day for men. One drink equals 12 oz of beer, 5 oz of wine, or 1 oz of hard liquor.  Do not use any tobacco products, such as cigarettes, chewing tobacco, and e-cigarettes. If you need help quitting, ask your health care provider. Work With Your Health Care Provider  Have your blood glucose tested regularly, as told by your health care provider.  Discuss your risk factors and how you can reduce your risk for diabetes.  Get screening tests as told by your health care provider. You may have screening tests regularly, especially if you have certain risk factors for type 2 diabetes.  Make an appointment with a diet and nutrition specialist (registered dietitian). A registered dietitian can help you make a healthy eating plan and can help you understand portion sizes and food labels. Why are these changes important?  It is possible to prevent or delay type 2 diabetes and related health problems by making  lifestyle and nutrition changes.  It can be difficult to recognize signs of type 2 diabetes. The best way to avoid possible damage to your body is to take actions to prevent the disease before you develop symptoms. What can happen if changes are not made?  Your blood glucose levels may keep increasing. Having high blood glucose for a long time is dangerous. Too much glucose in your blood can damage your blood vessels, heart, kidneys, nerves, and eyes.  You may develop prediabetes or type 2 diabetes. Type 2 diabetes can lead to many chronic health problems and complications, such as: ? Heart disease. ? Stroke. ? Blindness. ? Kidney disease. ? Depression. ? Poor circulation in the feet and legs, which could lead to surgical removal (amputation) in severe cases. Where to find support  Ask your health care provider to recommend a registered dietitian, diabetes educator, or weight loss program.  Look for local or online weight loss groups.  Join a gym, fitness club, or outdoor activity group, such as a walking club. Where to find more information To learn more about diabetes and diabetes prevention, visit:  American Diabetes Association (ADA): www.diabetes.[employer] of Diabetes and Digestive and Kidney Diseases: ToyArticles.ca To learn more about healthy eating, visit:  The U.S. Department of Agriculture Architect), Choose My Plate: http://yates.biz/  Office of Disease Prevention and Health Promotion (ODPHP), Dietary Guidelines: ListingMagazine.si Summary  You can reduce your risk for type 2 diabetes by increasing your physical activity, eating healthy foods, and losing weight as directed.  Talk with your health care provider about your risk for type 2 diabetes. Ask about any blood tests or screening tests that you need to have. This information is not intended to replace advice given to you by your health care  provider. Make sure you discuss any questions you have with your health care provider. Document Revised: 07/31/2018 Document Reviewed: 05/30/2015 Elsevier Patient Education  2020 [employer].

## 2019-08-17 ENCOUNTER — Other Ambulatory Visit: Payer: Self-pay | Admitting: Physician Assistant

## 2019-08-17 ENCOUNTER — Telehealth: Payer: Self-pay | Admitting: *Deleted

## 2019-08-17 DIAGNOSIS — E559 Vitamin D deficiency, unspecified: Secondary | ICD-10-CM

## 2019-08-17 DIAGNOSIS — E781 Pure hyperglyceridemia: Secondary | ICD-10-CM

## 2019-08-17 DIAGNOSIS — E785 Hyperlipidemia, unspecified: Secondary | ICD-10-CM

## 2019-08-17 LAB — CBC WITH DIFFERENTIAL/PLATELET
Basophils Absolute: 0.1 10*3/uL (ref 0.0–0.2)
Basos: 1 %
EOS (ABSOLUTE): 0.2 10*3/uL (ref 0.0–0.4)
Eos: 3 %
Hematocrit: 45.8 % (ref 37.5–51.0)
Hemoglobin: 15.8 g/dL (ref 13.0–17.7)
Immature Grans (Abs): 0.1 10*3/uL (ref 0.0–0.1)
Immature Granulocytes: 1 %
Lymphocytes Absolute: 1.5 10*3/uL (ref 0.7–3.1)
Lymphs: 19 %
MCH: 32.2 pg (ref 26.6–33.0)
MCHC: 34.5 g/dL (ref 31.5–35.7)
MCV: 94 fL (ref 79–97)
Monocytes Absolute: 0.7 10*3/uL (ref 0.1–0.9)
Monocytes: 9 %
Neutrophils Absolute: 5.4 10*3/uL (ref 1.4–7.0)
Neutrophils: 67 %
Platelets: 294 10*3/uL (ref 150–450)
RBC: 4.9 x10E6/uL (ref 4.14–5.80)
RDW: 14.2 % (ref 11.6–15.4)
WBC: 7.9 10*3/uL (ref 3.4–10.8)

## 2019-08-17 LAB — LIPID PANEL
Chol/HDL Ratio: 6.5 ratio — ABNORMAL HIGH (ref 0.0–5.0)
Cholesterol, Total: 181 mg/dL (ref 100–199)
HDL: 28 mg/dL — ABNORMAL LOW (ref >39)
LDL Chol Calc (NIH): 87 mg/dL (ref 0–99)
Triglycerides: 399 mg/dL — ABNORMAL HIGH (ref 0–149)
VLDL Cholesterol Cal: 66 mg/dL — ABNORMAL HIGH (ref 5–40)

## 2019-08-17 LAB — TSH: TSH: 0.886 u[IU]/mL (ref 0.450–4.500)

## 2019-08-17 LAB — HEPATITIS C ANTIBODY (REFLEX): HCV Ab: <0.1 s/co ratio (ref 0.0–0.9)

## 2019-08-17 LAB — HCV COMMENT:

## 2019-08-17 LAB — VITAMIN D 25 HYDROXY (VIT D DEFICIENCY, FRACTURES): Vit D, 25-Hydroxy: 15.3 ng/mL — ABNORMAL LOW (ref 30.0–100.0)

## 2019-08-17 LAB — PSA: Prostate Specific Ag, Serum: 0.6 ng/mL (ref 0.0–4.0)

## 2019-08-17 MED ORDER — ATORVASTATIN CALCIUM 10 MG PO TABS: 10.0000 mg | ORAL_TABLET | Freq: Every day | ORAL | Status: DC

## 2019-08-17 MED ORDER — VITAMIN D (ERGOCALCIFEROL) 1.25 MG (50000 UNIT) PO CAPS: 50000.0000 [IU] | ORAL_CAPSULE | ORAL | 0 refills | Status: DC

## 2019-08-17 MED FILL — ATORVASTATIN 10 MG TABLET: 10 | 30 days supply | Qty: 30 | Fill #0

## 2019-08-17 MED FILL — VIT D2 1.25 MG (50,000 UNIT: 1.25 MG | 84 days supply | Qty: 12 | Fill #0

## 2019-08-17 NOTE — Progress Notes (Signed)
Patient labs show elevated cholesterol,  The 10-year ASCVD risk score Denman George DC Montez Hageman., et al., 2013) is: 25.7%   Values used to calculate the score:     Age: 49 years     Sex: Male     Is Non-Hispanic African American: Yes     Diabetic: No     Tobacco smoker: Yes     Systolic Blood Pressure: 160 mmHg     Is BP treated: Yes     HDL Cholesterol: 28 mg/dL     Total Cholesterol: 181 mg/dL   Patient was restarted at office visit on Xarelto, will start patient on moderate intensity statin, encouraged lifestyle modifications

## 2019-08-17 NOTE — Telephone Encounter (Signed)
-----   Message from Roney Jaffe, [place] sent at 08/17/2019 11:08 AM EDT ----- Please call patient and let him know that his cholesterol was elevated, he does need to start medication to help lower his cholesterol.  His triglycerides were high, please advise to follow the mediterranean diet that we discussed during his visit.  I am sending a cholesterol medication to his pharmacy.  His vit d was low, he needs to start 50,000 units once a week for 12 weeks and then have it rechecked at that time.  That medication will also be sent to his pharmacy.  Thanks!

## 2019-08-17 NOTE — Telephone Encounter (Signed)
UTR patient due to phone being disconnected.

## 2019-09-02 ENCOUNTER — Other Ambulatory Visit: Payer: Self-pay

## 2019-09-02 ENCOUNTER — Ambulatory Visit: Payer: Self-pay | Attending: Family Medicine

## 2019-09-07 ENCOUNTER — Ambulatory Visit: Payer: Self-pay | Admitting: Internal Medicine

## 2019-09-22 ENCOUNTER — Ambulatory Visit: Payer: 59 | Admitting: Nurse Practitioner

## 2019-10-27 MED FILL — XARELTO 20 MG TABLET: 20 | 30 days supply | Qty: 30 | Fill #1

## 2019-10-27 MED FILL — CARTIA XT 300 MG CAPSULE SA: 300 | 30 days supply | Qty: 30 | Fill #1

## 2019-10-27 MED FILL — ATORVASTATIN 10 MG TABLET: 10 | 30 days supply | Qty: 30 | Fill #1

## 2019-10-27 MED FILL — METFORMIN HCL 500 MG TABS: 500 | 30 days supply | Qty: 30 | Fill #1

## 2019-10-27 MED FILL — LOSARTAN POTASSIUM 50 MG TA: 50 | 30 days supply | Qty: 30 | Fill #1

## 2020-01-10 MED FILL — ATORVASTATIN 10 MG TABLET: 10 | 30 days supply | Qty: 30 | Fill #2

## 2020-01-10 MED FILL — XARELTO 20 MG TABLET: 20 | 30 days supply | Qty: 30 | Fill #2

## 2020-01-10 MED FILL — CARTIA XT 300 MG CAPSULE SA: 300 | 30 days supply | Qty: 30 | Fill #2

## 2020-01-10 MED FILL — LOSARTAN POTASSIUM 50 MG TA: 50 | 30 days supply | Qty: 30 | Fill #2

## 2020-09-28 NOTE — Progress Notes (Deleted)
Subjective:  CC -- Annual Physical; With complaints of ***  Pt reports he ***   Cardiovascular: - Risk as of ***(date): *** (assessment every 3-5 years) - Dx Hypertension: yes, taking ***  - Dx Hyperlipidemia: yes, taking ***.  - Dx Obesity: {YES/NO/WILD CARDS:18581} (Class I BMI <34.9, Class II <39.9, Class III < 49.9)*** - Physical Activity: {YES/NO/WILD HERDE:08144}  - Diabetes: no, prediabetic at 6.1 in 2020.  Cancer: Colorectal >> Colonoscopy: {YES/NO/WILD CARDS:18581}  (Risk factors?, W/o risk factors > 50yo)*** Lung >> Tobacco Use: {YES/NO/WILD YJEHU:31497}  Prostate >> Interested in DRE and/or PSA: {YES/NO/WILD CARDS:18581}  (high risk 40-45yo, low risk >50yo; PSA at 55-69 is pt's choice)*** Skin >> Suspicious lesions: {YES/NO/WILD WYOVZ:85885}   Social: Alcohol Use: {YES/NO/WILD OYDXA:12878}  Tobacco Use: {YES/NO/WILD MVEHM:09470}   - Interested in Quitting: {YES/NO/WILD JGGEZ:66294}  Other Drugs: {YES/NO/WILD TMLYY:50354}  Risky Sexual Behavior: {YES/NO/WILD CARDS:18581}  - Chlamydia: If at increased risk - Gonorrhea: If at increased risk - Syphilis: If at increased risk - HIV: All individuals (15-18yo once, then annually if risks are high) >> should be checked anytime STDs are checked. - Hep C: once if born in [place] between 1945-1965; or at increased risk - Hep B: If at increased risk*** Depression: {YES/NO/WILD CARDS:18581}   - PHQ9 score:  Support and Life at Home: {YES/NO/WILD SFKCL:27517}   Other: Osteoporosis: {YES/NO/WILD CARDS:18581} (men w/ clinical manifestations of low bone mass)*** Zoster Vaccine: {YES/NO/WILD CARDS:18581} (those >50yo)*** Flu Vaccine: no, not Flu season.  Pneumonia Vaccine: {YES/NO/WILD GYFVC:94496} (those w/ risk factors)*** - Both: Immunocompromised, cochlear implant, CSF leak, asplenic, sickle cell, CKD - PPSV-23 only: Heart dz, lung disease, DM, tobacco abuse, alcoholism, cirrhosis/liver disease.***  ROS-  Past Medical  History Patient Active Problem List   Diagnosis Date Noted   Hypertriglyceridemia 07/24/2015   Abnormal EKG 09/13/2011   HTN (hypertension) 09/13/2011   Leukocytosis 09/13/2011   Abdominal pain - with Nausea & emesis 09/13/2011    Class: Acute   Dehydration 09/13/2011   Rhabdomyolysis 09/13/2011   Left ventricular diastolic dysfunction, NYHA class 2 09/13/2011   Polysubstance abuse ([place]) 08/01/2011   Atrial fibrillation with RVR ([place]) 08/01/2011   Acute renal failure ([place]) 08/01/2011    Medications- reviewed and updated Current Outpatient Medications  Medication Sig Dispense Refill   Accu-Chek Softclix Lancets lancets Use as instructed 100 each 12   atorvastatin (LIPITOR) 10 MG tablet Take 1 tablet (10 mg total) by mouth daily. 30 tablet 02   Blood Glucose Monitoring Suppl (TRUE METRIX AIR GLUCOSE METER) w/Device KIT 1 Units by Does not apply route 2 (two) times daily at 8 am and 10 pm. 1 kit 0   diltiazem (TIADYLT ER) 300 MG 24 hr capsule Take 1 capsule (300 mg total) by mouth daily. 30 capsule 2   glucose blood (TRUE METRIX PRO BLOOD GLUCOSE) test strip Use as instructed 100 each 12   losartan (COZAAR) 50 MG tablet Take 1 tablet (50 mg total) by mouth daily. 30 tablet 2   metFORMIN (GLUCOPHAGE) 500 MG tablet Take 1 tablet (500 mg total) by mouth daily with breakfast. 30 tablet 2   rivaroxaban (XARELTO) 20 MG TABS tablet Take 1 tablet (20 mg total) by mouth daily with supper. 30 tablet 2   Vitamin D, Ergocalciferol, (DRISDOL) 1.25 MG (50000 UNIT) CAPS capsule Take 1 capsule (50,000 Units total) by mouth every 7 (seven) days. 12 capsule 0   No current facility-administered medications for this visit.    Objective: There were no  vitals taken for this visit. Gen: NAD, alert, cooperative with exam*** HEENT: NCAT, EOMI, PERRL CV: RRR, good S1/S2, no murmur Resp: CTABL, no wheezes, non-labored Abd: Soft, Non Tender, Non Distended, BS present, no guarding or organomegaly Genital  Exam: {male genital:311510} Ext: No edema, warm Neuro: Alert and oriented, No gross deficits   Assessment/Plan:  No problem-specific Assessment & Plan notes found for this encounter.   No orders of the defined types were placed in this encounter.   No orders of the defined types were placed in this encounter.    Sharion Settler, DO 09/28/2020 12:29 PM

## 2020-09-29 ENCOUNTER — Ambulatory Visit: Payer: Self-pay | Admitting: Family Medicine

## 2020-10-18 ENCOUNTER — Telehealth: Payer: Self-pay | Admitting: Cardiovascular Disease

## 2020-10-18 NOTE — Telephone Encounter (Signed)
Patient is requesting all OV notes and records from when he saw Dr. Duke Salvia in 2017. 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

## 2020-10-19 ENCOUNTER — Ambulatory Visit: Payer: Self-pay | Admitting: Family Medicine

## 2020-12-26 NOTE — Progress Notes (Deleted)
   Subjective:    Patient ID: Raymond Short, male    DOB: 21-Jun-1970, 50 y.o.   MRN: [id number]  HPI No chief complaint on file.  He is new to the practice and here for a complete physical exam. Previous medical care: Last CPE:  Other providers:  Past medical history: Surgeries:  Family history: Mental health history:  Social history: Lives with ***, works as ***,  *** Smoking, drinking alcohol, drug use Diet: *** Exercise: ***  Immunizations:  Health maintenance:   Colonoscopy: Last PSA: Last Dental Exam: Last Eye Exam:  Wears seatbelt always, uses sunscreen, smoke detectors in home and functioning, does not text while driving, feels safe in home environment.  Reviewed allergies, medications, past medical, surgical, family, and social history.    Review of Systems Review of Systems Constitutional: -fever, -chills, -sweats, -unexpected weight change,-fatigue ENT: -runny nose, -ear pain, -sore throat Cardiology:  -chest pain, -palpitations, -edema Respiratory: -cough, -shortness of breath, -wheezing Gastroenterology: -abdominal pain, -nausea, -vomiting, -diarrhea, -constipation  Hematology: -bleeding or bruising problems Musculoskeletal: -arthralgias, -myalgias, -joint swelling, -back pain Ophthalmology: -vision changes Urology: -dysuria, -difficulty urinating, -hematuria, -urinary frequency, -urgency Neurology: -headache, -weakness, -tingling, -numbness       Objective:   Physical Exam There were no vitals taken for this visit.  General Appearance:    Alert, cooperative, no distress, appears stated age  Head:    Normocephalic, without obvious abnormality, atraumatic  Eyes:    PERRL, conjunctiva/corneas clear, EOM's intact, fundi    benign  Ears:    Normal TM's and external ear canals  Nose:   Nares normal, mucosa normal, no drainage or sinus   tenderness  Throat:   Lips, mucosa, and tongue normal; teeth and gums normal  Neck:   Supple, no  lymphadenopathy;  thyroid:  no   enlargement/tenderness/nodules; no carotid   bruit or JVD  Back:    Spine nontender, no curvature, ROM normal, no CVA     tenderness  Lungs:     Clear to auscultation bilaterally without wheezes, rales or     ronchi; respirations unlabored  Chest Wall:    No tenderness or deformity   Heart:    Regular rate and rhythm, S1 and S2 normal, no murmur, rub   or gallop  Breast Exam:    No chest wall tenderness, masses or gynecomastia  Abdomen:     Soft, non-tender, nondistended, normoactive bowel sounds,    no masses, no hepatosplenomegaly  Genitalia:    Normal male external genitalia without lesions.  Testicles without masses.  No inguinal hernias.  Rectal:    Normal sphincter tone, no masses or tenderness; guaiac negative stool.  Prostate smooth, no nodules, not enlarged.  Extremities:   No clubbing, cyanosis or edema  Pulses:   2+ and symmetric all extremities  Skin:   Skin color, texture, turgor normal, no rashes or lesions  Lymph nodes:   Cervical, supraclavicular, and axillary nodes normal  Neurologic:   CNII-XII intact, normal strength, sensation and gait; reflexes 2+ and symmetric throughout          Psych:   Normal mood, affect, hygiene and grooming.         Assessment & Plan:

## 2020-12-27 ENCOUNTER — Encounter: Payer: Self-pay | Admitting: Family Medicine

## 2021-02-21 ENCOUNTER — Other Ambulatory Visit: Payer: Self-pay

## 2021-02-21 ENCOUNTER — Encounter: Payer: Self-pay | Admitting: Family

## 2021-02-21 ENCOUNTER — Ambulatory Visit (INDEPENDENT_AMBULATORY_CARE_PROVIDER_SITE_OTHER): Payer: Managed Care, Other (non HMO) | Admitting: Family

## 2021-02-21 VITALS — BP 170/108 | HR 83 | Temp 98.2°F | Ht 71.0 in | Wt 207.0 lb

## 2021-02-21 DIAGNOSIS — E781 Pure hyperglyceridemia: Secondary | ICD-10-CM | POA: Diagnosis not present

## 2021-02-21 DIAGNOSIS — I480 Paroxysmal atrial fibrillation: Secondary | ICD-10-CM

## 2021-02-21 DIAGNOSIS — R7303 Prediabetes: Secondary | ICD-10-CM

## 2021-02-21 DIAGNOSIS — E785 Hyperlipidemia, unspecified: Secondary | ICD-10-CM | POA: Diagnosis not present

## 2021-02-21 DIAGNOSIS — I10 Essential (primary) hypertension: Secondary | ICD-10-CM

## 2021-02-21 DIAGNOSIS — K130 Diseases of lips: Secondary | ICD-10-CM | POA: Diagnosis not present

## 2021-02-21 MED ORDER — DILTIAZEM HCL ER BEADS 300 MG PO CP24: 300.0000 mg | ORAL_CAPSULE | Freq: Every day | ORAL | 2 refills | Status: DC

## 2021-02-21 MED ORDER — ATORVASTATIN CALCIUM 10 MG PO TABS: 10.0000 mg | ORAL_TABLET | Freq: Every day | ORAL | Status: DC

## 2021-02-21 MED ORDER — METFORMIN HCL 500 MG PO TABS: 500.0000 mg | ORAL_TABLET | Freq: Every day | ORAL | 2 refills | Status: DC

## 2021-02-21 MED ORDER — RIVAROXABAN 20 MG PO TABS: 20.0000 mg | ORAL_TABLET | Freq: Every day | ORAL | 2 refills | Status: DC

## 2021-02-21 MED ORDER — MUPIROCIN 2 % EX OINT: TOPICAL_OINTMENT | CUTANEOUS | 0 refills | Status: DC

## 2021-02-21 NOTE — Patient Instructions (Addendum)
Welcome to [facility]! It was a pleasure meeting you today.  As discussed, please go to the lab for blood work today. Your refills have been sent to your pharmacy, along with an antibiotic ointment for your angular chelitis.  Please schedule a 1 month follow up appointment today, and we can see you sooner for any concerns if needed.   PLEASE NOTE:  If you had any LAB tests please let us know if you have not heard back within a few days. You may see your results on MyChart before we have a chance to review them but we will give you a call once they are reviewed by [place]. If we ordered any REFERRALS today, please let us know if you have not heard from their office within the next week.  Let us know through MyChart if you are needing REFILLS, or have your pharmacy send [place] the request. You can also use MyChart to communicate with me or any office staff.  Please try these tips to maintain a healthy lifestyle:  Eat most of your calories during the day when you are active. Eliminate processed foods including packaged sweets (pies, cakes, cookies), reduce intake of potatoes, white bread, white pasta, and white rice. Look for whole grain options, oat flour or almond flour.  Each meal should contain half fruits/vegetables, one quarter protein, and one quarter carbs (no bigger than a computer mouse).  Cut down on sweet beverages. This includes juice, soda, and sweet tea. Also watch fruit intake, though this is a healthier sweet option, it still contains natural sugar! Limit to 3 servings daily.  Drink at least 1 glass of water with each meal and aim for at least 8 glasses per day  Exercise at least 150 minutes every week.

## 2021-02-21 NOTE — Progress Notes (Signed)
New Patient Office Visit  Subjective:  Patient ID: Raymond Short, male    DOB: [birth year]  Age: 50 y.o. MRN: [id number]  CC:  Chief Complaint  Patient presents with   Hypertension    Pt has not had insurance for medications in a while.   Rash    Corners of mouth   Hyperlipidemia   Diabetes    HPI Raymond Short presents to establish care and discuss several chronic and one new acute problems. Diabetes Type 2 Pt is currently maintained on the following medications for diabetes: Metformin (not been taking). Denies polyuria/polydipsia. Denies hypoglycemia. Home glucose readings range: pt does not check at home. Lab Results  Component Value Date   HGBA1C 6.1 02/21/2021   HGBA1C 6.1 (A) 01/04/2019   HGBA1C 5.5 08/01/2011    Lab Results  Component Value Date   LDLCALC 87 08/16/2019   CREATININE 1.16 02/21/2021  Hyperlipidemia Patient is currently maintained on the following medication for hyperlipidemia: Crestor. Patient denies myalgia. Patient reports good compliance with low fat/low cholesterol diet.  Last lipid panel as follows:  Lab Results  Component Value Date   CHOL 191 02/21/2021   HDL 29.00 (L) 02/21/2021   LDLCALC 87 08/16/2019   LDLDIRECT 81.0 02/21/2021   TRIG (H) 02/21/2021    666.0 Triglyceride is over 400; calculations on Lipids are invalid.   CHOLHDL 7 02/21/2021  Hypertension Patient is currently maintained on the following medications for blood pressure: Diltiazem, Losartan Patient reports poor compliance with blood pressure medications d/t no insurance. Patient denies chest pain, shortness of breath or swelling. Last 3 blood pressure readings in our office are as follows: BP Readings from Last 3 Encounters:  02/21/21 (!) 170/108  08/16/19 (!) 160/110  01/04/19 (!) 145/86   Rash: reports painful cracking in bilateral corners of his mouth starting about a month ago. Has tried lip ointments, vaseline, without relief. Denies itching or  swelling, reports increased moisture in the areas.  Past Medical History:  Diagnosis Date   Hypertension    Hypertriglyceridemia 07/24/2015   Paroxysmal atrial fibrillation (HCC)    Polysubstance abuse (HCC)    History of EtOH, Cocaine    Past Surgical History:  Procedure Laterality Date   CARDIOVERSION     WISDOM TOOTH EXTRACTION      Family History  Problem Relation Age of Onset   Cirrhosis Father    Heart failure Mother    Stroke Maternal Grandfather    Prostate cancer Maternal Grandfather     Social History   Socioeconomic History   Marital status: Married    Spouse name: Not on file   Number of children: Not on file   Years of education: Not on file   Highest education level: Not on file  Occupational History   Not on file  Tobacco Use   Smoking status: Every Day    Packs/day: 1.00    Years: 24.00    Pack years: 24.00    Types: Cigarettes   Smokeless tobacco: Never  Vaping Use   Vaping Use: Never used  Substance and Sexual Activity   Alcohol use: Yes    Alcohol/week: 8.0 standard drinks    Types: 8 Cans of beer per week    Comment: 2-3 x week   Drug use: Yes    Types: Cocaine    Comment: Denies recent drug use   Sexual activity: Yes  Other Topics Concern   Not on file  Social History Narrative   Not  on file   Social Determinants of Health   Financial Resource Strain: Not on file  Food Insecurity: Not on file  Transportation Needs: Not on file  Physical Activity: Not on file  Stress: Not on file  Social Connections: Not on file  Intimate Partner Violence: Not on file    Objective:   Today's Vitals: BP (!) 170/108   Pulse 83   Temp 98.2 F (36.8 C) (Temporal)   Ht 5' 11"  (1.803 m)   Wt 207 lb (93.9 kg)   SpO2 96%   BMI 28.87 kg/m   Physical Exam Vitals and nursing note reviewed.  Constitutional:      General: He is not in acute distress.    Appearance: Normal appearance.  HENT:     Head: Normocephalic.  Cardiovascular:     Rate  and Rhythm: Normal rate and regular rhythm.  Pulmonary:     Effort: Pulmonary effort is normal.     Breath sounds: Normal breath sounds.  Musculoskeletal:        General: Normal range of motion.     Cervical back: Normal range of motion.  Skin:    General: Skin is warm and dry.     Findings: Laceration (bilateral corners of mouth, with mild maceration) and rash present.  Neurological:     Mental Status: He is alert and oriented to person, place, and time.  Psychiatric:        Mood and Affect: Mood normal.    Assessment & Plan:   Problem List Items Addressed This Visit       Cardiovascular and Mediastinum   HTN (hypertension) - Primary (Chronic)   Relevant Medications   diltiazem (TIADYLT ER) 300 MG 24 hr capsule   atorvastatin (LIPITOR) 10 MG tablet   rivaroxaban (XARELTO) 20 MG TABS tablet   Other Relevant Orders   Comp Met (CMET) (Completed)   Paroxysmal atrial fibrillation with rapid ventricular response (HCC)    Has not been taking Xarelto due to no insurance. Reports he has not had any sx for years and he could tell when his heart was out of rhythm. Advised pt he needs to f/u again with cardiology. Heart in NSR today.      Relevant Medications   diltiazem (TIADYLT ER) 300 MG 24 hr capsule   atorvastatin (LIPITOR) 10 MG tablet   rivaroxaban (XARELTO) 20 MG TABS tablet   Essential hypertension    Asymptomatic. Pt had no insurance, unable to get into office for visit to refill meds. Advised he must pick up meds and restart today due to very high BP in office. Advised to seek ER if any chest pain, nausea, dizziness.      Relevant Medications   diltiazem (TIADYLT ER) 300 MG 24 hr capsule   atorvastatin (LIPITOR) 10 MG tablet   rivaroxaban (XARELTO) 20 MG TABS tablet     Digestive   Angular cheilitis    Bilateral corners of mouth. Sending abt ointment to use after cleaning and drying area well.      Relevant Medications   mupirocin ointment (BACTROBAN) 2 %      Other   Hyperlipidemia    With hx of triglyceridemia per old labs. Pt also with hx of alcohol abuse. Has not been taking Lipitor due to no insurance, refilled med today and Rechecking labs today.      Relevant Medications   diltiazem (TIADYLT ER) 300 MG 24 hr capsule   atorvastatin (LIPITOR) 10 MG tablet   rivaroxaban (  XARELTO) 20 MG TABS tablet   Other Relevant Orders   Lipid panel (Completed)   Borderline type 2 diabetes mellitus    Ran out of metformin, was not taking regularly, told he did not need to, rechecking A1C today.      Relevant Orders   HgB A1c (Completed) Relevant Medications  metFORMIN (GLUCOPHAGE) 500 MG tablet               Outpatient Encounter Medications as of 02/21/2021  Medication Sig   Accu-Chek Softclix Lancets lancets Use as instructed   Blood Glucose Monitoring Suppl (TRUE METRIX AIR GLUCOSE METER) w/Device KIT 1 Units by Does not apply route 2 (two) times daily at 8 am and 10 pm.   glucose blood (TRUE METRIX PRO BLOOD GLUCOSE) test strip Use as instructed   mupirocin ointment (BACTROBAN) 2 % Apply small amount onto corners of mouth twice a day.   [DISCONTINUED] atorvastatin (LIPITOR) 10 MG tablet Take 1 tablet (10 mg total) by mouth daily.   [DISCONTINUED] metFORMIN (GLUCOPHAGE) 500 MG tablet Take 1 tablet (500 mg total) by mouth daily with breakfast.   [DISCONTINUED] rivaroxaban (XARELTO) 20 MG TABS tablet Take 1 tablet (20 mg total) by mouth daily with supper.   atorvastatin (LIPITOR) 10 MG tablet Take 1 tablet (10 mg total) by mouth daily.   diltiazem (TIADYLT ER) 300 MG 24 hr capsule Take 1 capsule (300 mg total) by mouth daily.   metFORMIN (GLUCOPHAGE) 500 MG tablet Take 1 tablet (500 mg total) by mouth daily with breakfast.   rivaroxaban (XARELTO) 20 MG TABS tablet Take 1 tablet (20 mg total) by mouth daily with supper.   Vitamin D, Ergocalciferol, (DRISDOL) 1.25 MG (50000 UNIT) CAPS capsule Take 1 capsule (50,000 Units total) by mouth every 7  (seven) days.   [DISCONTINUED] diltiazem (TIADYLT ER) 300 MG 24 hr capsule Take 1 capsule (300 mg total) by mouth daily. (Patient not taking: Reported on 02/21/2021)   [DISCONTINUED] losartan (COZAAR) 50 MG tablet Take 1 tablet (50 mg total) by mouth daily.   No facility-administered encounter medications on file as of 02/21/2021.    Follow-up: Return in about 4 weeks (around 03/21/2021) for HTN, medication review.   Jeanie Sewer, NP

## 2021-02-22 LAB — COMPREHENSIVE METABOLIC PANEL
ALT: 13 U/L (ref 0–53)
AST: 20 U/L (ref 0–37)
Albumin: 4.7 g/dL (ref 3.5–5.2)
Alkaline Phosphatase: 51 U/L (ref 39–117)
BUN: 15 mg/dL (ref 6–23)
CO2: 27 mEq/L (ref 19–32)
Calcium: 10 mg/dL (ref 8.4–10.5)
Chloride: 104 mEq/L (ref 96–112)
Creatinine, Ser: 1.16 mg/dL (ref 0.40–1.50)
GFR: 72.25 mL/min (ref >60.00)
Glucose, Bld: 92 mg/dL (ref 70–99)
Potassium: 4.3 mEq/L (ref 3.5–5.1)
Sodium: 139 mEq/L (ref 135–145)
Total Bilirubin: 0.4 mg/dL (ref 0.2–1.2)
Total Protein: 7 g/dL (ref 6.0–8.3)

## 2021-02-22 LAB — HEMOGLOBIN A1C: Hgb A1c MFr Bld: 6.1 % (ref 4.6–6.5)

## 2021-02-22 LAB — LIPID PANEL
Cholesterol: 191 mg/dL (ref 0–200)
HDL: 29 mg/dL — ABNORMAL LOW (ref >39.00)
Total CHOL/HDL Ratio: 7
Triglycerides: 666 mg/dL — ABNORMAL HIGH (ref 0.0–149.0)

## 2021-02-22 LAB — LDL CHOLESTEROL, DIRECT: Direct LDL: 81 mg/dL

## 2021-02-26 ENCOUNTER — Encounter: Payer: Self-pay | Admitting: Family

## 2021-02-26 DIAGNOSIS — I10 Essential (primary) hypertension: Secondary | ICD-10-CM | POA: Insufficient documentation

## 2021-02-26 NOTE — Assessment & Plan Note (Signed)
Ran out of metformin, was not taking regularly, told he did not need to, rechecking A1C today.

## 2021-02-26 NOTE — Assessment & Plan Note (Signed)
Bilateral corners of mouth. Sending abt ointment to use after cleaning and drying area well.

## 2021-02-26 NOTE — Assessment & Plan Note (Signed)
Has not been taking Xarelto due to no insurance. Reports he has not had any sx for years and he could tell when his heart was out of rhythm. Advised pt he needs to f/u again with cardiology. Heart in NSR today.

## 2021-02-26 NOTE — Assessment & Plan Note (Signed)
With hx of triglyceridemia per old labs. Pt also with hx of alcohol abuse. Has not been taking Lipitor due to no insurance, refilled med today and Rechecking labs today.

## 2021-02-26 NOTE — Assessment & Plan Note (Addendum)
Asymptomatic. Pt had no insurance, unable to get into office for visit to refill meds. Advised he must pick up meds and restart today due to very high BP in office. Advised to seek ER if any chest pain, nausea, dizziness.

## 2021-03-05 ENCOUNTER — Other Ambulatory Visit: Payer: Self-pay | Admitting: Family

## 2021-03-05 ENCOUNTER — Ambulatory Visit: Payer: Managed Care, Other (non HMO) | Admitting: Family

## 2021-03-05 ENCOUNTER — Other Ambulatory Visit: Payer: Self-pay

## 2021-03-05 DIAGNOSIS — E781 Pure hyperglyceridemia: Secondary | ICD-10-CM

## 2021-03-05 MED ORDER — FENOFIBRATE 145 MG PO TABS
145.0000 mg | ORAL_TABLET | Freq: Every day | ORAL | 0 refills | Status: DC
Start: 2021-03-05 — End: 2021-03-05

## 2021-03-05 MED ORDER — FENOFIBRATE 145 MG PO TABS: 145.0000 mg | ORAL_TABLET | Freq: Every day | ORAL | 0 refills | Status: DC

## 2021-03-05 NOTE — Telephone Encounter (Signed)
Sent to correct pharmacy

## 2021-03-06 ENCOUNTER — Other Ambulatory Visit: Payer: Self-pay | Admitting: Family

## 2021-03-06 DIAGNOSIS — E781 Pure hyperglyceridemia: Secondary | ICD-10-CM

## 2021-03-06 MED ORDER — FENOFIBRATE 160 MG PO TABS: 160.0000 mg | ORAL_TABLET | Freq: Every day | ORAL | 0 refills | Status: DC

## 2021-03-06 NOTE — Telephone Encounter (Signed)
He needs to call his insurance to see which similar medications are covered, this way we can send that to his pharmacy.

## 2021-03-14 ENCOUNTER — Ambulatory Visit: Payer: Managed Care, Other (non HMO) | Admitting: Family

## 2021-03-23 ENCOUNTER — Ambulatory Visit (INDEPENDENT_AMBULATORY_CARE_PROVIDER_SITE_OTHER): Payer: Self-pay | Admitting: Family

## 2021-03-23 ENCOUNTER — Other Ambulatory Visit: Payer: Self-pay

## 2021-03-23 ENCOUNTER — Encounter: Payer: Self-pay | Admitting: Family

## 2021-03-23 VITALS — BP 155/79 | HR 80 | Temp 98.0°F | Ht 71.0 in | Wt 214.4 lb

## 2021-03-23 DIAGNOSIS — I10 Essential (primary) hypertension: Secondary | ICD-10-CM

## 2021-03-23 DIAGNOSIS — N529 Male erectile dysfunction, unspecified: Secondary | ICD-10-CM | POA: Insufficient documentation

## 2021-03-23 MED ORDER — TADALAFIL 5 MG PO TABS
5.0000 mg | ORAL_TABLET | Freq: Every day | ORAL | 1 refills | Status: DC | PRN
Start: 2021-03-23 — End: 2021-10-27

## 2021-03-23 MED ORDER — OLMESARTAN MEDOXOMIL 20 MG PO TABS: 20.0000 mg | ORAL_TABLET | Freq: Every day | ORAL | 0 refills | Status: DC

## 2021-03-23 NOTE — Patient Instructions (Addendum)
It was very nice to see you today!  As discussed, I have sent a new blood pressure med and the Cialis to your pharmacy. Follow directions on the bottles. Be sure you are drinking at least 2 liters of water daily and eating a low sodium diet.   I'd like to see you again in 9months for follow up and fasting labs.  Call cardiology to get evaluated again and see if the Xarelto blood thinner is still needed or not.  Have a [place] Christmas!   PLEASE NOTE:  If you had any lab tests please let us know if you have not heard back within a few days. You may see your results on MyChart before we have a chance to review them but we will give you a call once they are reviewed by [place]. If we ordered any referrals today, please let us know if you have not heard from their office within the next week.   Please try these tips to maintain a healthy lifestyle:  Eat most of your calories during the day when you are active. Eliminate processed foods including packaged sweets (pies, cakes, cookies), reduce intake of potatoes, white bread, white pasta, and white rice. Look for whole grain options, oat flour or almond flour.  Each meal should contain half fruits/vegetables, one quarter protein, and one quarter carbs (no bigger than a computer mouse).  Cut down on sweet beverages. This includes juice, soda, and sweet tea. Also watch fruit intake, though this is a healthier sweet option, it still contains natural sugar! Limit to 3 servings daily.  Drink at least 1 glass of water with each meal and aim for at least 8 glasses per day  Exercise at least 150 minutes every week.

## 2021-03-23 NOTE — Progress Notes (Signed)
Subjective:     Patient ID: Raymond Short, male    DOB: 1970-06-21, 50 y.o.   MRN: [id number]  Chief Complaint  Patient presents with   Hypertension    1 month follow up    HPI Hypertension: Patient is currently maintained on the following medications for blood pressure: Cardizem Failed meds include Losartan (erectile dysfunction) Patient reports good compliance with blood pressure medications. Patient denies chest pain, shortness of breath or swelling. Last 3 blood pressure readings in our office are as follows: BP Readings from Last 3 Encounters:  03/23/21 (!) 155/79  02/21/21 (!) 170/108  08/16/19 (!) 160/110   Erectile Dysfunction: Patient complains of erectile dysfunction.  Onset of dysfunction was 6 months ago and was gradual in onset.  Patient states the nature of difficulty is both attaining and maintaining erection. Full erections occur never. Partial erections occur with intercourse. Libido is not affected. Risk factors for ED include cardiovascular disease and diabetes mellitus. Patient denies history of urologic disease, neurologic disease. Patient's description of relationship w/partner good.  Previous treatment of ED includes nothing.    Health Maintenance Due  Topic Date Due   Pneumococcal Vaccine 77-54 Years old (1 - PCV) Never done   HIV Screening  Never done   COLONOSCOPY (Pts 45-52yr Insurance coverage will need to be confirmed)  Never done   Zoster Vaccines- Shingrix (1 of 2) Never done    Past Medical History:  Diagnosis Date   Hypertriglyceridemia 07/24/2015   [place]    History of EtOH, Cocaine   Rhabdomyolysis 09/13/2011    Past Surgical History:  Procedure Laterality Date   CARDIOVERSION     WISDOM TOOTH EXTRACTION      Outpatient Medications Prior to Visit  Medication Sig Dispense Refill   Accu-Chek Softclix Lancets lancets Use as instructed 100 each 12   atorvastatin (LIPITOR) 10 MG tablet Take 1 tablet (10 mg total) by  mouth daily. 30 tablet 02   Blood Glucose Monitoring Suppl (TRUE METRIX AIR GLUCOSE METER) w/Device KIT 1 Units by Does not apply route 2 (two) times daily at 8 am and 10 pm. 1 kit 0   diltiazem (TIADYLT ER) 300 MG 24 hr capsule Take 1 capsule (300 mg total) by mouth daily. 30 capsule 2   fenofibrate 160 MG tablet Take 1 tablet (160 mg total) by mouth daily. 90 tablet 0   glucose blood (TRUE METRIX PRO BLOOD GLUCOSE) test strip Use as instructed 100 each 12   metFORMIN (GLUCOPHAGE) 500 MG tablet Take 1 tablet (500 mg total) by mouth daily with breakfast. 30 tablet 2   mupirocin ointment (BACTROBAN) 2 % Apply small amount onto corners of mouth twice a day. 22 g 0   rivaroxaban (XARELTO) 20 MG TABS tablet Take 1 tablet (20 mg total) by mouth daily with supper. 30 tablet 2   Vitamin D, Ergocalciferol, (DRISDOL) 1.25 MG (50000 UNIT) CAPS capsule Take 1 capsule (50,000 Units total) by mouth every 7 (seven) days. 12 capsule 0   No facility-administered medications prior to visit.    No Known Allergies      Objective:    Physical Exam Vitals and nursing note reviewed.  Constitutional:      General: He is not in acute distress.    Appearance: Normal appearance.  HENT:     Head: Normocephalic.  Cardiovascular:     Rate and Rhythm: Normal rate and regular rhythm.  Pulmonary:     Effort: Pulmonary effort is  normal.     Breath sounds: Normal breath sounds.  Musculoskeletal:        General: Normal range of motion.     Cervical back: Normal range of motion.  Skin:    General: Skin is warm and dry.  Neurological:     Mental Status: He is alert and oriented to person, place, and time.  Psychiatric:        Mood and Affect: Mood normal.    BP (!) 155/79   Pulse 80   Temp 98 F (36.7 C) (Temporal)   Ht 5' 11"  (1.803 m)   Wt 214 lb 6.4 oz (97.3 kg)   SpO2 95%   BMI 29.90 kg/m  Wt Readings from Last 3 Encounters:  03/23/21 214 lb 6.4 oz (97.3 kg)  02/21/21 207 lb (93.9 kg)  08/16/19  210 lb (95.3 kg)       Assessment & Plan:   Problem List Items Addressed This Visit       Cardiovascular and Mediastinum   Essential hypertension    Pt BP better but still elevated, taking Diltiazem qd, starting Olmesartan. F/U in mos. w/labs.      Relevant Medications   olmesartan (BENICAR) 20 MG tablet   tadalafil (CIALIS) 5 MG tablet     Other   Erectile dysfunction - Primary    A1C 6.1, pt restarted Metformin, still having ED sx. Starting Cialis, pt advised on use & SE, start with lowest dose first, then increase prn.       Relevant Medications   tadalafil (CIALIS) 5 MG tablet    Meds ordered this encounter  Medications   olmesartan (BENICAR) 20 MG tablet    Sig: Take 1 tablet (20 mg total) by mouth daily.    Dispense:  90 tablet    Refill:  0    Order Specific Question:   Supervising Provider    Answer:   ANDY, CAMILLE L [[id number]]   tadalafil (CIALIS) 5 MG tablet    Sig: Take 1-4 tablets (5-20 mg total) by mouth daily as needed for erectile dysfunction. Start with 1 pill, if not working can take up to 4 only, total, in 24 hours    Dispense:  30 tablet    Refill:  1    Order Specific Question:   Supervising Provider    Answer:   [place], [place]

## 2021-03-25 ENCOUNTER — Encounter: Payer: Self-pay | Admitting: Family

## 2021-03-25 NOTE — Assessment & Plan Note (Signed)
Pt BP better but still elevated, taking Diltiazem qd, starting Olmesartan. F/U in mos. w/labs.

## 2021-03-25 NOTE — Assessment & Plan Note (Signed)
A1C 6.1, pt restarted Metformin, still having ED sx. Starting Cialis, pt advised on use & SE, start with lowest dose first, then increase prn.

## 2021-03-29 ENCOUNTER — Ambulatory Visit: Payer: 59 | Admitting: Family

## 2021-05-10 ENCOUNTER — Ambulatory Visit: Payer: Self-pay | Admitting: Family

## 2021-05-25 ENCOUNTER — Ambulatory Visit: Payer: Managed Care, Other (non HMO) | Admitting: Family

## 2021-07-27 ENCOUNTER — Emergency Department (HOSPITAL_BASED_OUTPATIENT_CLINIC_OR_DEPARTMENT_OTHER): Payer: No Typology Code available for payment source

## 2021-07-27 ENCOUNTER — Emergency Department (HOSPITAL_BASED_OUTPATIENT_CLINIC_OR_DEPARTMENT_OTHER)
Admission: EM | Admit: 2021-07-27 | Discharge: 2021-07-27 | Disposition: A | Discharge status: Home / Self Care | Payer: No Typology Code available for payment source | Attending: Emergency Medicine | Admitting: Emergency Medicine

## 2021-07-27 ENCOUNTER — Encounter (HOSPITAL_BASED_OUTPATIENT_CLINIC_OR_DEPARTMENT_OTHER): Payer: Self-pay | Admitting: *Deleted

## 2021-07-27 ENCOUNTER — Other Ambulatory Visit: Payer: Self-pay

## 2021-07-27 DIAGNOSIS — Z7901 Long term (current) use of anticoagulants: Secondary | ICD-10-CM | POA: Insufficient documentation

## 2021-07-27 DIAGNOSIS — S8992XA Unspecified injury of left lower leg, initial encounter: Secondary | ICD-10-CM | POA: Diagnosis not present

## 2021-07-27 DIAGNOSIS — Z79899 Other long term (current) drug therapy: Secondary | ICD-10-CM | POA: Insufficient documentation

## 2021-07-27 DIAGNOSIS — I10 Essential (primary) hypertension: Secondary | ICD-10-CM | POA: Insufficient documentation

## 2021-07-27 DIAGNOSIS — S4992XA Unspecified injury of left shoulder and upper arm, initial encounter: Secondary | ICD-10-CM | POA: Diagnosis not present

## 2021-07-27 DIAGNOSIS — G8911 Acute pain due to trauma: Secondary | ICD-10-CM | POA: Insufficient documentation

## 2021-07-27 DIAGNOSIS — S199XXA Unspecified injury of neck, initial encounter: Secondary | ICD-10-CM | POA: Diagnosis present

## 2021-07-27 DIAGNOSIS — M25562 Pain in left knee: Secondary | ICD-10-CM

## 2021-07-27 DIAGNOSIS — Y92410 Unspecified street and highway as the place of occurrence of the external cause: Secondary | ICD-10-CM | POA: Diagnosis not present

## 2021-07-27 DIAGNOSIS — S161XXA Strain of muscle, fascia and tendon at neck level, initial encounter: Secondary | ICD-10-CM | POA: Diagnosis not present

## 2021-07-27 MED ORDER — NAPROXEN 250 MG PO TABS
500.0000 mg | ORAL_TABLET | Freq: Once | ORAL | Status: AC
  Administered 2021-07-27: 500 mg via ORAL
  Filled 2021-07-27: qty 2

## 2021-07-27 MED ORDER — NAPROXEN 500 MG PO TABS: 500.0000 mg | ORAL_TABLET | Freq: Two times a day (BID) | ORAL | 0 refills | Status: DC

## 2021-07-27 MED ORDER — HYDROCHLOROTHIAZIDE 25 MG PO TABS: 25.0000 mg | ORAL_TABLET | Freq: Every day | ORAL | 0 refills | Status: DC

## 2021-07-27 MED ORDER — CYCLOBENZAPRINE HCL 10 MG PO TABS: 10.0000 mg | ORAL_TABLET | Freq: Two times a day (BID) | ORAL | 0 refills | Status: DC | PRN

## 2021-07-27 MED ORDER — HYDROCHLOROTHIAZIDE 25 MG PO TABS
25.0000 mg | ORAL_TABLET | Freq: Once | ORAL | Status: AC
  Administered 2021-07-27: 25 mg via ORAL
  Filled 2021-07-27: qty 1

## 2021-07-27 NOTE — Discharge Instructions (Addendum)
Your imaging today was very reassuring and was without signs of dislocation or fracture.  I sent you in a prescription for an anti-inflammatory pain medication which you can take twice daily.  Have also sent you in a prescription for a muscle relaxer which may help with some of that muscle stiffness and help make you drowsy before bed. Avoid driving while on this medication.  ? ?I have given you some blood pressure medication here in the ED but please follow up with the [facility] to have this managed long term. ?

## 2021-07-27 NOTE — ED Notes (Signed)
Client has hx of HTN, states he does not any insurance at this time to be able to get his medication. ?
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
## 2021-07-27 NOTE — ED Notes (Signed)
Spoke with PA regards to current NBP readings and unable to get meds.  ?

## 2021-07-27 NOTE — ED Notes (Signed)
Has full ROM of both rt and lt upper extremities, has strong plantar and dorsal flexion of Lt foot, able to place wt on LLE without issue, gait steady.  ?

## 2021-10-27 ENCOUNTER — Emergency Department (HOSPITAL_BASED_OUTPATIENT_CLINIC_OR_DEPARTMENT_OTHER)
Admission: EM | Admit: 2021-10-27 | Discharge: 2021-10-27 | Disposition: A | Discharge status: Home / Self Care | Payer: Self-pay | Attending: Emergency Medicine | Admitting: Emergency Medicine

## 2021-10-27 ENCOUNTER — Emergency Department (HOSPITAL_BASED_OUTPATIENT_CLINIC_OR_DEPARTMENT_OTHER): Payer: Self-pay

## 2021-10-27 ENCOUNTER — Encounter (HOSPITAL_BASED_OUTPATIENT_CLINIC_OR_DEPARTMENT_OTHER): Payer: Self-pay | Admitting: Emergency Medicine

## 2021-10-27 ENCOUNTER — Other Ambulatory Visit: Payer: Self-pay

## 2021-10-27 DIAGNOSIS — Z79899 Other long term (current) drug therapy: Secondary | ICD-10-CM | POA: Insufficient documentation

## 2021-10-27 DIAGNOSIS — I10 Essential (primary) hypertension: Secondary | ICD-10-CM

## 2021-10-27 DIAGNOSIS — Z7901 Long term (current) use of anticoagulants: Secondary | ICD-10-CM | POA: Insufficient documentation

## 2021-10-27 DIAGNOSIS — N3001 Acute cystitis with hematuria: Secondary | ICD-10-CM

## 2021-10-27 LAB — COMPREHENSIVE METABOLIC PANEL
ALT: 17 U/L (ref 0–44)
AST: 23 U/L (ref 15–41)
Albumin: 4.1 g/dL (ref 3.5–5.0)
Alkaline Phosphatase: 49 U/L (ref 38–126)
Anion gap: 6 (ref 5–15)
BUN: 12 mg/dL (ref 6–20)
CO2: 24 mmol/L (ref 22–32)
Calcium: 9 mg/dL (ref 8.9–10.3)
Chloride: 110 mmol/L (ref 98–111)
Creatinine, Ser: 0.9 mg/dL (ref 0.61–1.24)
GFR, Estimated: >60 mL/min (ref >60)
Glucose, Bld: 92 mg/dL (ref 70–99)
Potassium: 3.4 mmol/L — ABNORMAL LOW (ref 3.5–5.1)
Sodium: 140 mmol/L (ref 135–145)
Total Bilirubin: 0.3 mg/dL (ref 0.3–1.2)
Total Protein: 7.1 g/dL (ref 6.5–8.1)

## 2021-10-27 LAB — CBC WITH DIFFERENTIAL/PLATELET
Abs Immature Granulocytes: 0.04 10*3/uL (ref 0.00–0.07)
Basophils Absolute: 0 10*3/uL (ref 0.0–0.1)
Basophils Relative: 1 %
Eosinophils Absolute: 0.2 10*3/uL (ref 0.0–0.5)
Eosinophils Relative: 3 %
HCT: 41 % (ref 39.0–52.0)
Hemoglobin: 14.3 g/dL (ref 13.0–17.0)
Immature Granulocytes: 1 %
Lymphocytes Relative: 38 %
Lymphs Abs: 2.3 10*3/uL (ref 0.7–4.0)
MCH: 32.4 pg (ref 26.0–34.0)
MCHC: 34.9 g/dL (ref 30.0–36.0)
MCV: 93 fL (ref 80.0–100.0)
Monocytes Absolute: 0.4 10*3/uL (ref 0.1–1.0)
Monocytes Relative: 7 %
Neutro Abs: 3 10*3/uL (ref 1.7–7.7)
Neutrophils Relative %: 50 %
Platelets: 221 10*3/uL (ref 150–400)
RBC: 4.41 MIL/uL (ref 4.22–5.81)
RDW: 14.2 % (ref 11.5–15.5)
Smear Review: NORMAL
WBC: 6 10*3/uL (ref 4.0–10.5)
nRBC: 0 % (ref 0.0–0.2)

## 2021-10-27 LAB — URINALYSIS, ROUTINE W REFLEX MICROSCOPIC
Bilirubin Urine: NEGATIVE
Glucose, UA: 100 mg/dL — AB
Ketones, ur: NEGATIVE mg/dL
Leukocytes,Ua: NEGATIVE
Nitrite: NEGATIVE
Protein, ur: 100 mg/dL — AB
Specific Gravity, Urine: >=1.03 (ref 1.005–1.030)
pH: 5.5 (ref 5.0–8.0)

## 2021-10-27 LAB — URINALYSIS, MICROSCOPIC (REFLEX)

## 2021-10-27 LAB — LIPASE, BLOOD: Lipase: 50 U/L (ref 11–51)

## 2021-10-27 MED ORDER — OLMESARTAN MEDOXOMIL 20 MG PO TABS: 20.0000 mg | ORAL_TABLET | Freq: Every day | ORAL | 0 refills | Status: DC

## 2021-10-27 MED ORDER — CEPHALEXIN 250 MG PO CAPS
500.0000 mg | ORAL_CAPSULE | Freq: Once | ORAL | Status: AC
  Administered 2021-10-27: 500 mg via ORAL
  Filled 2021-10-27: qty 2

## 2021-10-27 MED ORDER — HYDROCHLOROTHIAZIDE 25 MG PO TABS: 25.0000 mg | ORAL_TABLET | Freq: Every day | ORAL | 0 refills | Status: DC

## 2021-10-27 MED ORDER — CEPHALEXIN 500 MG PO CAPS: 500.0000 mg | ORAL_CAPSULE | Freq: Two times a day (BID) | ORAL | 0 refills | Status: DC

## 2021-10-27 MED ORDER — CLONIDINE HCL 0.1 MG PO TABS
0.1000 mg | ORAL_TABLET | Freq: Once | ORAL | Status: AC
  Administered 2021-10-27: 0.1 mg via ORAL
  Filled 2021-10-27: qty 1

## 2021-10-27 NOTE — ED Triage Notes (Signed)
Pt arrives pov, steady gait, c/o lower abdominal pain, dysuria and hematuria. Denies fever

## 2021-10-27 NOTE — ED Provider Notes (Signed)
[facility] Provider Note   CSN: [id number] Arrival date & time: 10/27/21  1438     History  Chief Complaint  Patient presents with   Abdominal Pain    Raymond Short is a 51 y.o. male with hx significant for Afib not on anticoagulation, HTN here for evaluation of hematuria and dysuria. Some lower abd fullness. Feels like his urine stream is not strong as it typically is.  No pain with bowel movements.  No history of BPH.  Is concerned about kidney stone however denies any prior history of kidney stones or flank pain.  No fever, nausea, vomiting.  No history of prostatitis.  He is sexually active, no discharge.  Feels like he is emptying his bladder completely. No hx of similar.  HPI     Home Medications Prior to Admission medications   Medication Sig Start Date End Date Taking? Authorizing Provider  cephALEXin (KEFLEX) 500 MG capsule Take 1 capsule (500 mg total) by mouth 2 (two) times daily. 10/27/21  Yes Lawanda Holzheimer A, PA-C  Accu-Chek Softclix Lancets lancets Use as instructed 08/16/19   Mayers, Cari S, PA-C  atorvastatin (LIPITOR) 10 MG tablet Take 1 tablet (10 mg total) by mouth daily. 02/21/21   Jeanie Sewer, NP  Blood Glucose Monitoring Suppl (TRUE METRIX AIR GLUCOSE METER) w/Device KIT 1 Units by Does not apply route 2 (two) times daily at 8 am and 10 pm. 08/16/19   Mayers, Cari S, PA-C  cyclobenzaprine (FLEXERIL) 10 MG tablet Take 1 tablet (10 mg total) by mouth 2 (two) times daily as needed for muscle spasms. 07/27/21   Tonye Pearson, PA-C  diltiazem (TIADYLT ER) 300 MG 24 hr capsule Take 1 capsule (300 mg total) by mouth daily. 02/21/21 05/22/21  Jeanie Sewer, NP  fenofibrate 160 MG tablet Take 1 tablet (160 mg total) by mouth daily. 03/06/21   Jeanie Sewer, NP  glucose blood (TRUE METRIX PRO BLOOD GLUCOSE) test strip Use as instructed 08/16/19   Mayers, Cari S, PA-C  hydrochlorothiazide (HYDRODIURIL) 25 MG tablet Take 1  tablet (25 mg total) by mouth daily. 10/27/21   Marea Reasner A, PA-C  metFORMIN (GLUCOPHAGE) 500 MG tablet Take 1 tablet (500 mg total) by mouth daily with breakfast. 02/21/21 05/22/21  Jeanie Sewer, NP  mupirocin ointment (BACTROBAN) 2 % Apply small amount onto corners of mouth twice a day. 02/21/21   Jeanie Sewer, NP  naproxen (NAPROSYN) 500 MG tablet Take 1 tablet (500 mg total) by mouth 2 (two) times daily. 07/27/21   Tonye Pearson, PA-C  olmesartan (BENICAR) 20 MG tablet Take 1 tablet (20 mg total) by mouth daily. 10/27/21   Taegan Standage A, PA-C  rivaroxaban (XARELTO) 20 MG TABS tablet Take 1 tablet (20 mg total) by mouth daily with supper. 02/21/21   Jeanie Sewer, NP  tadalafil (CIALIS) 5 MG tablet Take 1-4 tablets (5-20 mg total) by mouth daily as needed for erectile dysfunction. Start with 1 pill, if not working can take up to 4 only, total, in 24 hours 03/23/21   Jeanie Sewer, NP      Allergies    Patient has no known allergies.    Review of Systems   Review of Systems  Constitutional: Negative.   HENT: Negative.    Respiratory: Negative.    Cardiovascular: Negative.   Gastrointestinal:  Positive for abdominal pain. Negative for abdominal distention, anal bleeding, blood in stool, constipation, diarrhea, nausea, rectal pain and vomiting.  Genitourinary:  Positive for dysuria, frequency and hematuria. Negative for decreased urine volume, penile discharge, penile pain, penile swelling, scrotal swelling, testicular pain and urgency.  Musculoskeletal: Negative.   Skin: Negative.   Neurological: Negative.   All other systems reviewed and are negative.   Physical Exam Updated Vital Signs BP (!) 172/115   Pulse 70   Temp 98.6 F (37 C) (Oral)   Resp 16   Ht 5' 10"  (1.778 m)   Wt 95.3 kg   SpO2 99%   BMI 30.13 kg/m  Physical Exam Vitals and nursing note reviewed.  Constitutional:      General: He is not in acute distress.    Appearance: He is  well-developed. He is not ill-appearing, toxic-appearing or diaphoretic.  HENT:     Head: Atraumatic.  Eyes:     Pupils: Pupils are equal, round, and reactive to light.  Cardiovascular:     Rate and Rhythm: Normal rate and regular rhythm.     Pulses: Normal pulses.          Radial pulses are 2+ on the right side and 2+ on the left side.       Dorsalis pedis pulses are 2+ on the right side and 2+ on the left side.     Heart sounds: Normal heart sounds.  Pulmonary:     Effort: Pulmonary effort is normal. No respiratory distress.     Breath sounds: Normal breath sounds.  Abdominal:     General: Bowel sounds are normal. There is no distension.     Palpations: Abdomen is soft.     Tenderness: There is no abdominal tenderness. There is no right CVA tenderness, left CVA tenderness, guarding or rebound.     Comments: Soft, nontender, negative CVA tap bilaterally  Musculoskeletal:        General: Normal range of motion.     Cervical back: Normal range of motion and neck supple.  Skin:    General: Skin is warm and dry.     Capillary Refill: Capillary refill takes less than 2 seconds.  Neurological:     General: No focal deficit present.     Mental Status: He is alert and oriented to person, place, and time.     ED Results / Procedures / Treatments   Labs (all labs ordered are listed, but only abnormal results are displayed) Labs Reviewed  URINALYSIS, ROUTINE W REFLEX MICROSCOPIC - Abnormal; Notable for the following components:      Result Value   Glucose, UA 100 (*)    Hgb urine dipstick LARGE (*)    Protein, ur 100 (*)    All other components within normal limits  COMPREHENSIVE METABOLIC PANEL - Abnormal; Notable for the following components:   Potassium 3.4 (*)    All other components within normal limits  URINALYSIS, MICROSCOPIC (REFLEX) - Abnormal; Notable for the following components:   Bacteria, UA MANY (*)    All other components within normal limits  URINE CULTURE  CBC  WITH DIFFERENTIAL/PLATELET  LIPASE, BLOOD  GC/CHLAMYDIA PROBE AMP ([facility]) NOT AT [facility]    EKG None  Radiology CT Renal Stone Study  Result Date: 10/27/2021 CLINICAL DATA:  Flank pain hematuria EXAM: CT ABDOMEN AND PELVIS WITHOUT CONTRAST TECHNIQUE: Multidetector CT imaging of the abdomen and pelvis was performed following the standard protocol without IV contrast. RADIATION DOSE REDUCTION: This exam was performed according to the departmental dose-optimization program which includes automated exposure control, adjustment of the mA and/or kV according to  patient size and/or use of iterative reconstruction technique. COMPARISON:  None Available. FINDINGS: Lower chest: Mild cardiomegaly with trace pericardial effusion. Lung bases are clear. Hepatobiliary: No focal liver abnormality is seen. No gallstones, gallbladder wall thickening, or biliary dilatation. Pancreas: Unremarkable. No pancreatic ductal dilatation or surrounding inflammatory changes. Spleen: Normal in size without focal abnormality. Adrenals/Urinary Tract: Diffuse thickening of the adrenal glands without dominant mass. Kidneys show no hydronephrosis. No intrarenal stones. No ureteral stone. Bladder slightly thick walled. Stomach/Bowel: Stomach is within normal limits. Appendix appears normal. No evidence of bowel wall thickening, distention, or inflammatory changes. Minimal diverticular disease of the left colon. Vascular/Lymphatic: Mild aortic atherosclerosis. Infrarenal abdominal aortic aneurysm measuring up to 3.4 cm. No suspicious lymph nodes. Reproductive: Prostate slightly enlarged. Other: Negative for pelvic effusion or free air. Small fat containing umbilical hernia. Musculoskeletal: 23 no acute osseous abnormality. Irregular facet degenerative changes at L4-L5 with suggestion of mild erosions. IMPRESSION: 1. Negative for hydronephrosis or ureteral stone. Minimal thick-walled appearance of the urinary bladder, nonspecific but  suggest correlation for cystitis 2. Mild cardiomegaly with trace pericardial effusion 3. Infrarenal abdominal aortic aneurysm measuring up to 3.4 cm. Recommend follow-up ultrasound every 3 years. This recommendation follows ACR consensus guidelines: White Paper of the ACR Incidental Findings Committee II on Vascular Findings. J Am Coll Radiol 2013; 10:789-794. 4. Irregular facet arthropathy at L4-L5 with suggestion of mild erosive change. Correlate for symptoms of back pain to this level, follow-up MRI may be obtained if deemed clinically appropriate. Electronically Signed   By: Donavan Foil M.D.   On: 10/27/2021 17:11    Procedures Procedures    Medications Ordered in ED Medications  cephALEXin (KEFLEX) capsule 500 mg (has no administration in time range)  cloNIDine (CATAPRES) tablet 0.1 mg (0.1 mg Oral Given 10/27/21 1803)    ED Course/ Medical Decision Making/ A&P     51 year old here for evaluation of hematuria, dysuria and change in urinary stream.  He admits to some suprapubic fullness hard denies any abdominal pain.  On arrival he is hypertensive however states he has not been on his blood pressure medication over the last few months due to being uninsured.  He has no chest pain, shortness of breath, back pain, numbness or weakness.  No pain with bowel movements.  Plan on labs, imaging and reassess  Labs and imaging personally viewed and interpreted:  UA with blood, many bacteria, sent for culture Lipase 50 CBC without leukocytosis Metabolic panel potassium 3.4 CT stone study without any kidney stones, does show bladder wall thickening, correlate for cystitis.  3.4 cm abdominal aneurysm, Rec FU in 3 years  Reassessed.  I discussed his labs and imaging.  Based off history, exam and imaging feel he most likely has cystitis.  Patient placed on antibiotics, first dose given here.  He does not have any pain currently.  His blood pressures have still remained elevated.  I will refill his  prior home blood pressure medications.  States he does have insurance next month where he plans to establish PCP.  I have low suspicion for acute hypertensive urgency or emergency at this time.  On repeat exam patient does not have a surgical abdomin and there are no peritoneal signs.  No indication of appendicitis, bowel obstruction, bowel perforation, cholecystitis, diverticulitis, AAA, dissection.    The patient has been appropriately medically screened and/or stabilized in the ED. I have low suspicion for any other emergent medical condition which would require further screening, evaluation or treatment in  the ED or require inpatient management.  Patient is hemodynamically stable and in no acute distress.  Patient able to ambulate in department prior to ED.  Evaluation does not show acute pathology that would require ongoing or additional emergent interventions while in the emergency department or further inpatient treatment.  I have discussed the diagnosis with the patient and answered all questions.  Pain is been managed while in the emergency department and patient has no further complaints prior to discharge.  Patient is comfortable with plan discussed in room and is stable for discharge at this time.  I have discussed strict return precautions for returning to the emergency department.  Patient was encouraged to follow-up with PCP/specialist refer to at discharge.                            Medical Decision Making Amount and/or Complexity of Data Reviewed External Data Reviewed: labs, radiology and notes. Labs: ordered. Decision-making details documented in ED Course. Radiology: ordered and independent interpretation performed. Decision-making details documented in ED Course.  Risk OTC drugs. Prescription drug management. Decision regarding hospitalization. Diagnosis or treatment significantly limited by social determinants of health.          Final Clinical Impression(s) / ED  Diagnoses Final diagnoses:  Acute cystitis with hematuria  Hypertension, unspecified type    Rx / DC Orders ED Discharge Orders          Ordered    hydrochlorothiazide (HYDRODIURIL) 25 MG tablet  Daily        10/27/21 1910    olmesartan (BENICAR) 20 MG tablet  Daily        10/27/21 1910    cephALEXin (KEFLEX) 500 MG capsule  2 times daily        10/27/21 1911              Genavive Kubicki A, PA-C 10/27/21 1925    Drenda Freeze, MD 10/27/21 2244

## 2021-10-27 NOTE — ED Notes (Signed)
Pt stated due to insurance not starting until next month he has been off of his BP medication for about 3 months.

## 2021-10-27 NOTE — Discharge Instructions (Addendum)
Your urine showed positive for infection which is likely the cause of your symptoms.  Take the antibiotics as prescribed.  I have also refilled your blood pressure medication.  Is important to take this as your blood pressure was significantly elevated here in the emergency department  Follow up with primary care provider  Return for new or worsening symptoms

## 2021-10-29 LAB — URINE CULTURE: Culture: NO GROWTH

## 2021-10-29 LAB — GC/CHLAMYDIA PROBE AMP (~~LOC~~) NOT AT ARMC
Chlamydia: NEGATIVE
Comment: NEGATIVE
Comment: NORMAL
Neisseria Gonorrhea: NEGATIVE

## 2021-12-13 ENCOUNTER — Other Ambulatory Visit: Payer: Self-pay | Admitting: Family

## 2021-12-13 DIAGNOSIS — I480 Paroxysmal atrial fibrillation: Secondary | ICD-10-CM

## 2021-12-17 ENCOUNTER — Telehealth: Payer: Self-pay

## 2021-12-17 NOTE — Telephone Encounter (Signed)
NOTES SCANNED TO REFERRAL 

## 2022-01-14 ENCOUNTER — Encounter: Payer: Self-pay | Admitting: *Deleted

## 2022-03-08 ENCOUNTER — Other Ambulatory Visit: Payer: Self-pay | Admitting: Family

## 2022-03-08 DIAGNOSIS — I10 Essential (primary) hypertension: Secondary | ICD-10-CM

## 2022-03-08 DIAGNOSIS — R7303 Prediabetes: Secondary | ICD-10-CM

## 2022-03-08 DIAGNOSIS — I480 Paroxysmal atrial fibrillation: Secondary | ICD-10-CM

## 2022-04-04 ENCOUNTER — Encounter: Payer: Self-pay | Admitting: *Deleted

## 2022-05-23 ENCOUNTER — Ambulatory Visit: Payer: Self-pay | Admitting: Gerontology

## 2022-05-23 ENCOUNTER — Encounter: Payer: Self-pay | Admitting: Gerontology

## 2022-05-23 ENCOUNTER — Other Ambulatory Visit: Payer: Self-pay

## 2022-05-23 VITALS — BP 154/93 | HR 85 | Temp 98.5°F | Resp 16 | Ht 70.5 in | Wt 194.8 lb

## 2022-05-23 DIAGNOSIS — Z87898 Personal history of other specified conditions: Secondary | ICD-10-CM

## 2022-05-23 DIAGNOSIS — R7303 Prediabetes: Secondary | ICD-10-CM

## 2022-05-23 DIAGNOSIS — I10 Essential (primary) hypertension: Secondary | ICD-10-CM

## 2022-05-23 DIAGNOSIS — E785 Hyperlipidemia, unspecified: Secondary | ICD-10-CM

## 2022-05-23 DIAGNOSIS — G4700 Insomnia, unspecified: Secondary | ICD-10-CM

## 2022-05-23 DIAGNOSIS — Z7689 Persons encountering health services in other specified circumstances: Secondary | ICD-10-CM

## 2022-05-23 LAB — GLUCOSE, POCT (MANUAL RESULT ENTRY): POC Glucose: 111 mg/dl — AB (ref 70–99)

## 2022-05-23 LAB — POCT GLYCOSYLATED HEMOGLOBIN (HGB A1C): Hemoglobin A1C: 6.2 % — AB (ref 4.0–5.6)

## 2022-05-23 MED ORDER — AMLODIPINE BESYLATE 5 MG PO TABS
5.0000 mg | ORAL_TABLET | Freq: Every day | ORAL | 0 refills | Status: AC
  Filled 2022-05-23: qty 30, 30d supply, fill #0

## 2022-05-23 MED ORDER — METFORMIN HCL ER 500 MG PO TB24
500.0000 mg | ORAL_TABLET | Freq: Every day | ORAL | 0 refills | Status: AC
  Filled 2022-05-23: qty 30, 30d supply, fill #0

## 2022-05-23 MED ORDER — ATORVASTATIN CALCIUM 10 MG PO TABS
10.0000 mg | ORAL_TABLET | Freq: Every day | ORAL | 0 refills | Status: AC
  Filled 2022-05-23: qty 30, 30d supply, fill #0

## 2022-05-23 MED ORDER — HYDROCHLOROTHIAZIDE 25 MG PO TABS
25.0000 mg | ORAL_TABLET | Freq: Every day | ORAL | 0 refills | Status: AC
  Filled 2022-05-23: qty 30, 30d supply, fill #0

## 2022-05-23 MED ORDER — MELATONIN 3 MG PO CAPS
9.0000 mg | ORAL_CAPSULE | Freq: Every day | ORAL | 0 refills | Status: AC
  Filled 2022-05-23: qty 90, 30d supply, fill #0

## 2022-05-23 NOTE — Progress Notes (Signed)
New Patient Office Visit  Subjective    Patient ID: Raymond Short, male    DOB: November 14, 1970  Age: 52 y.o. MRN: [id number]  CC:  Chief Complaint  Patient presents with   Establish Care   Hypertension   Hyperlipidemia   substance abuse    Patient at [facility] for drug and alcohol abuse   Prediabetes    Patient has a history of prediabetes. Patient has not taken any Metformin in >6 months.   Atrial Fibrillation    Patient has a history of atrial fib. Patient has not been on any medications for >4 years ago.   Insomnia    Patient c/o insomnia since being at [facility]    HPI Raymond Short is a 52 y/o male who has a history of Afib, hyperlipidemia, hypertension, polysubstance use disorder who presents to establish care. He reports taking Amlodipine 5 mg and Hctz 25 mg for hypertension and Atorvastatin 10 mg for hyperlipidemia. His blood pressure during visit was 154/93. He states that he's compliant with his medications, denies side effects and continues to make healthy lifestyle changes. Currently, he resides at [facility] for polysubstance use rehabilitation.  He states that he is having difficulty falling asleep and staying asleep. He states that he gets 2 hours of sleep.  He reports taking 10 mg of Melatonin with no relief and his sleeping problems started 5 days ago after going through 1 week of Detox . His HgbA1c was 6.2% and blood glucose was 111 mg/dl. He denies chest pain, palpitation, shortness of breath and vision changes. Overall, he states that he's doing well and offers no further complaint.   Outpatient Encounter Medications as of 05/23/2022  Medication Sig   Melatonin 3 MG CAPS Take 3 capsules (9 mg total) by mouth at bedtime.   metFORMIN (GLUCOPHAGE-XR) 500 MG 24 hr tablet Take 1 tablet (500 mg total) by mouth daily with breakfast.   [DISCONTINUED] amLODipine (NORVASC) 5 MG tablet Take 5 mg by mouth daily.   [DISCONTINUED] atorvastatin (LIPITOR) 10 MG tablet Take 10 mg by mouth  daily.   [DISCONTINUED] hydrochlorothiazide (HYDRODIURIL) 25 MG tablet Take 1 tablet (25 mg total) by mouth daily.   amLODipine (NORVASC) 5 MG tablet Take 1 tablet (5 mg total) by mouth daily.   atorvastatin (LIPITOR) 10 MG tablet Take 1 tablet (10 mg total) by mouth daily.   hydrochlorothiazide (HYDRODIURIL) 25 MG tablet Take 1 tablet (25 mg total) by mouth daily.   [DISCONTINUED] Accu-Chek Softclix Lancets lancets Use as instructed   [DISCONTINUED] Blood Glucose Monitoring Suppl (TRUE METRIX AIR GLUCOSE METER) w/Device KIT 1 Units by Does not apply route 2 (two) times daily at 8 am and 10 pm.   [DISCONTINUED] cephALEXin (KEFLEX) 500 MG capsule Take 1 capsule (500 mg total) by mouth 2 (two) times daily.   [DISCONTINUED] glucose blood (TRUE METRIX PRO BLOOD GLUCOSE) test strip Use as instructed   [DISCONTINUED] mupirocin ointment (BACTROBAN) 2 % Apply small amount onto corners of mouth twice a day.   [DISCONTINUED] olmesartan (BENICAR) 20 MG tablet Take 1 tablet (20 mg total) by mouth daily.   No facility-administered encounter medications on file as of 05/23/2022.    Past Medical History:  Diagnosis Date   History of atrial fibrillation    History of prediabetes    Hypertension    Hypertriglyceridemia 07/24/2015   Polysubstance abuse ([place])    History of EtOH, Cocaine   Rhabdomyolysis 09/13/2011    Past Surgical History:  Procedure Laterality  Date   CARDIOVERSION     WISDOM TOOTH EXTRACTION      Family History  Problem Relation Age of Onset   Hypertension Mother    Heart failure Mother    Alcohol abuse Father    Cirrhosis Father    Other Sister        unknown medical history   Other Sister        unknown medical history   Other Sister        unknown medical history   Other Brother        unknown medical history   Other Brother        unknown medical history   Other Brother        unknown medical history   Other Maternal Grandmother        unknown medical history    Stroke Maternal Grandfather    Prostate cancer Maternal Grandfather    Other Paternal Grandmother        unknown medical history   Other Paternal Grandfather        unknown medical history    Social History   Socioeconomic History   Marital status: Married    Spouse name: Not on file   Number of children: Not on file   Years of education: Not on file   Highest education level: Not on file  Occupational History   Not on file  Tobacco Use   Smoking status: Every Day    Packs/day: 0.75    Years: 24.00    Total pack years: 18.00    Types: Cigarettes   Smokeless tobacco: Never  Vaping Use   Vaping Use: Never used  Substance and Sexual Activity   Alcohol use: Not Currently    Alcohol/week: 84.0 standard drinks of alcohol    Types: 84 Cans of beer per week    Comment: last use 05/05/22, previously drank 12 pk per day   Drug use: Not Currently    Types: Cocaine    Comment: last use 05/02/22   Sexual activity: Yes  Other Topics Concern   Not on file  Social History Narrative   Not on file   Social Determinants of Health   Financial Resource Strain: Not on file  Food Insecurity: No Food Insecurity (05/23/2022)   Hunger Vital Sign    Worried About Running Out of Food in the Last Year: Never true    Ran Out of Food in the Last Year: Never true  Transportation Needs: No Transportation Needs (05/23/2022)   PRAPARE - Hydrologist (Medical): No    Lack of Transportation (Non-Medical): No  Physical Activity: Not on file  Stress: Not on file  Social Connections: Not on file  Intimate Partner Violence: Not At Risk (05/23/2022)   Humiliation, Afraid, Rape, and Kick questionnaire    Fear of Current or Ex-Partner: No    Emotionally Abused: No    Physically Abused: No    Sexually Abused: No    Review of Systems  Constitutional: Negative.   HENT: Negative.    Eyes: Negative.   Respiratory: Negative.    Cardiovascular: Negative.   Gastrointestinal:  Negative.   Genitourinary: Negative.   Musculoskeletal: Negative.   Skin: Negative.   Neurological: Negative.   Endo/Heme/Allergies: Negative.   Psychiatric/Behavioral: Negative.          Objective    BP (!) 154/93 (BP Location: Right Arm, Patient Position: Sitting, Cuff Size: Large)   Pulse 85  Temp 98.5 F (36.9 C) (Oral)   Resp 16   Ht 5' 10.5" (1.791 m)   Wt 194 lb 12.8 oz (88.4 kg)   SpO2 97%   BMI 27.56 kg/m   Physical Exam HENT:     Head: Normocephalic and atraumatic.     Nose: Nose normal.     Mouth/Throat:     Mouth: Mucous membranes are moist.  Eyes:     Pupils: Pupils are equal, round, and reactive to light.  Cardiovascular:     Rate and Rhythm: Normal rate and regular rhythm.     Pulses: Normal pulses.     Heart sounds: Normal heart sounds.  Pulmonary:     Effort: Pulmonary effort is normal.     Breath sounds: Normal breath sounds.  Abdominal:     General: Abdomen is flat. Bowel sounds are normal.     Palpations: Abdomen is soft.  Genitourinary:    Comments: Deferred per patient Musculoskeletal:        General: Normal range of motion.     Cervical back: Normal range of motion.  Skin:    General: Skin is warm.  Neurological:     General: No focal deficit present.     Mental Status: He is alert and oriented to person, place, and time. Mental status is at baseline.  Psychiatric:        Mood and Affect: Mood normal.        Behavior: Behavior normal.        Thought Content: Thought content normal.        Judgment: Judgment normal.         Assessment & Plan:    1. Essential hypertension -Blood pressure was elevated during visit and not under control.  He was advised to continue on current medication, DASH diet and exercise as tolerated.  He was encouraged on smoking cessation. - amLODipine (NORVASC) 5 MG tablet; Take 1 tablet (5 mg total) by mouth daily.  Dispense: 30 tablet; Refill: 0 - hydrochlorothiazide (HYDRODIURIL) 25 MG tablet; Take  1 tablet (25 mg total) by mouth daily.  Dispense: 30 tablet; Refill: 0  2. Borderline type 2 diabetes mellitus -His hemoglobin A1c was 6.2%, he will continue on metformin, low carbohydrate/no concentrated sweet diet and exercise as tolerated.  He was educated on medication side effects and advised to notify clinic. - metFORMIN (GLUCOPHAGE-XR) 500 MG 24 hr tablet; Take 1 tablet (500 mg total) by mouth daily with breakfast.  Dispense: 30 tablet; Refill: 0  3. Encounter to establish care -Routine labs will be checked. - CBC w/Diff; Future - Comp Met (CMET); Future - HgB A1c; Future - Lipid panel; Future - PSA; Future - PSA - Lipid panel - HgB A1c - Comp Met (CMET) - CBC w/Diff  4. Insomnia, unspecified type -He was advised to practice proper sleep hygiene and take melatonin.  He will follow-up with [facility] behavioral health. - Melatonin 3 MG CAPS; Take 3 capsules (9 mg total) by mouth at bedtime.  Dispense: 90 capsule; Refill: 0  5. Hyperlipidemia, unspecified hyperlipidemia type --He will continue current medication, will recheck lipid panel.  He was encouraged to continue on low fat/cholesterol diet and exercise as tolerated. atorvastatin (LIPITOR) 10 MG tablet; Take 1 tablet (10 mg total) by mouth daily.  Dispense: 30 tablet; Refill: 0  Return in about 4 weeks (around 06/20/2022), or if symptoms worsen or fail to improve.   Mando Blatz Jerold Coombe, NP

## 2022-05-23 NOTE — Patient Instructions (Addendum)
Heart-Healthy Eating Plan Many factors influence your heart health, including eating and exercise habits. Heart health is also called coronary health. Coronary risk increases with abnormal blood fat (lipid) levels. A heart-healthy eating plan includes limiting unhealthy fats, increasing healthy fats, limiting salt (sodium) intake, and making other diet and lifestyle changes. What is my plan? Your health care provider may recommend that: You limit your fat intake to _________% or less of your total calories each day. You limit your saturated fat intake to _________% or less of your total calories each day. You limit the amount of cholesterol in your diet to less than _________ mg per day. You limit the amount of sodium in your diet to less than _________ mg per day. What are tips for following this plan? Cooking Cook foods using methods other than frying. Baking, boiling, grilling, and broiling are all good options. Other ways to reduce fat include: Removing the skin from poultry. Removing all visible fats from meats. Steaming vegetables in water or broth. Meal planning  At meals, imagine dividing your plate into fourths: Fill one-half of your plate with vegetables and green salads. Fill one-fourth of your plate with whole grains. Fill one-fourth of your plate with lean protein foods. Eat 2-4 cups of vegetables per day. One cup of vegetables equals 1 cup (91 g) broccoli or cauliflower florets, 2 medium carrots, 1 large bell pepper, 1 large sweet potato, 1 large tomato, 1 medium white potato, 2 cups (150 g) raw leafy greens. Eat 1-2 cups of fruit per day. One cup of fruit equals 1 small apple, 1 large banana, 1 cup (237 g) mixed fruit, 1 large orange,  cup (82 g) dried fruit, 1 cup (240 mL) 100% fruit juice. Eat more foods that contain soluble fiber. Examples include apples, broccoli, carrots, beans, peas, and barley. Aim to get 25-30 g of fiber per day. Increase your consumption of legumes,  nuts, and seeds to 4-5 servings per week. One serving of dried beans or legumes equals  cup (90 g) cooked, 1 serving of nuts is  oz (12 almonds, 24 pistachios, or 7 walnut halves), and 1 serving of seeds equals  oz (8 g). Fats Choose healthy fats more often. Choose monounsaturated and polyunsaturated fats, such as olive and canola oils, avocado oil, flaxseeds, walnuts, almonds, and seeds. Eat more omega-3 fats. Choose salmon, mackerel, sardines, tuna, flaxseed oil, and ground flaxseeds. Aim to eat fish at least 2 times each week. Check food labels carefully to identify foods with trans fats or high amounts of saturated fat. Limit saturated fats. These are found in animal products, such as meats, butter, and cream. Plant sources of saturated fats include palm oil, palm kernel oil, and coconut oil. Avoid foods with partially hydrogenated oils in them. These contain trans fats. Examples are stick margarine, some tub margarines, cookies, crackers, and other baked goods. Avoid fried foods. General information Eat more home-cooked food and less restaurant, buffet, and fast food. Limit or avoid alcohol. Limit foods that are high in added sugar and simple starches such as foods made using white refined flour (white breads, pastries, sweets). Lose weight if you are overweight. Losing just 5-10% of your body weight can help your overall health and prevent diseases such as diabetes and heart disease. Monitor your sodium intake, especially if you have high blood pressure. Talk with your health care provider about your sodium intake. Try to incorporate more vegetarian meals weekly. What foods should I eat? Fruits All fresh, canned (in natural   juice), or frozen fruits. Vegetables Fresh or frozen vegetables (raw, steamed, roasted, or grilled). Green salads. Grains Most grains. Choose whole wheat and whole grains most of the time. Rice and pasta, including brown rice and pastas made with whole wheat. Meats  and other proteins Lean, well-trimmed beef, veal, pork, and lamb. Chicken and [place] without skin. All fish and shellfish. Wild duck, rabbit, pheasant, and venison. Egg whites or low-cholesterol egg substitutes. Dried beans, peas, lentils, and tofu. Seeds and most nuts. Dairy Low-fat or nonfat cheeses, including ricotta and mozzarella. Skim or 1% milk (liquid, powdered, or evaporated). Buttermilk made with low-fat milk. Nonfat or low-fat yogurt. Fats and oils Non-hydrogenated (trans-free) margarines. Vegetable oils, including soybean, sesame, sunflower, olive, avocado, peanut, safflower, corn, canola, and cottonseed. Salad dressings or mayonnaise made with a vegetable oil. Beverages Water (mineral or sparkling). Coffee and tea. Unsweetened ice tea. Diet beverages. Sweets and desserts Sherbet, gelatin, and fruit ice. Small amounts of dark chocolate. Limit all sweets and desserts. Seasonings and condiments All seasonings and condiments. The items listed above may not be a complete list of foods and beverages you can eat. Contact a dietitian for more options. What foods should I avoid? Fruits Canned fruit in heavy syrup. Fruit in cream or butter sauce. Fried fruit. Limit coconut. Vegetables Vegetables cooked in cheese, cream, or butter sauce. Fried vegetables. Grains Breads made with saturated or trans fats, oils, or whole milk. Croissants. Sweet rolls. Donuts. High-fat crackers, such as cheese crackers and chips. Meats and other proteins Fatty meats, such as hot dogs, ribs, sausage, bacon, rib-eye roast or steak. High-fat deli meats, such as salami and bologna. Caviar. Domestic duck and goose. Organ meats, such as liver. Dairy Cream, sour cream, cream cheese, and creamed cottage cheese. Whole-milk cheeses. Whole or 2% milk (liquid, evaporated, or condensed). Whole buttermilk. Cream sauce or high-fat cheese sauce. Whole-milk yogurt. Fats and oils Meat fat, or shortening. Cocoa butter,  hydrogenated oils, palm oil, coconut oil, palm kernel oil. Solid fats and shortenings, including bacon fat, salt pork, lard, and butter. Nondairy cream substitutes. Salad dressings with cheese or sour cream. Beverages Regular sodas and any drinks with added sugar. Sweets and desserts Frosting. Pudding. Cookies. Cakes. Pies. Milk chocolate or white chocolate. Buttered syrups. Full-fat ice cream or ice cream drinks. The items listed above may not be a complete list of foods and beverages to avoid. Contact a dietitian for more information. Summary Heart-healthy meal planning includes limiting unhealthy fats, increasing healthy fats, limiting salt (sodium) intake and making other diet and lifestyle changes. Lose weight if you are overweight. Losing just 5-10% of your body weight can help your overall health and prevent diseases such as diabetes and heart disease. Focus on eating a balance of foods, including fruits and vegetables, low-fat or nonfat dairy, lean protein, nuts and legumes, whole grains, and heart-healthy oils and fats. This information is not intended to replace advice given to you by your health care provider. Make sure you discuss any questions you have with your health care provider. Document Revised: 05/14/2021 Document Reviewed: 05/14/2021 Elsevier Patient Education  [place] Eating Plan DASH stands for Dietary Approaches to Stop Hypertension. The DASH eating plan is a healthy eating plan that has been shown to: Reduce high blood pressure (hypertension). Reduce your risk for type 2 diabetes, heart disease, and stroke. Help with weight loss. What are tips for following this plan? Reading food labels Check food labels for the amount of salt (sodium) per serving. Choose  foods with less than 5 percent of the Daily Value of sodium. Generally, foods with less than 300 milligrams (mg) of sodium per serving fit into this eating plan. To find whole grains, look for the word  "whole" as the first word in the ingredient list. Shopping Buy products labeled as "low-sodium" or "no salt added." Buy fresh foods. Avoid canned foods and pre-made or frozen meals. Cooking Avoid adding salt when cooking. Use salt-free seasonings or herbs instead of table salt or sea salt. Check with your health care provider or pharmacist before using salt substitutes. Do not fry foods. Cook foods using healthy methods such as baking, boiling, grilling, roasting, and broiling instead. Cook with heart-healthy oils, such as olive, canola, avocado, soybean, or sunflower oil. Meal planning  Eat a balanced diet that includes: 4 or more servings of fruits and 4 or more servings of vegetables each day. Try to fill one-half of your plate with fruits and vegetables. 6-8 servings of whole grains each day. Less than 6 oz (170 g) of lean meat, poultry, or fish each day. A 3-oz (85-g) serving of meat is about the same size as a deck of cards. One egg equals 1 oz (28 g). 2-3 servings of low-fat dairy each day. One serving is 1 cup (237 mL). 1 serving of nuts, seeds, or beans 5 times each week. 2-3 servings of heart-healthy fats. Healthy fats called omega-3 fatty acids are found in foods such as walnuts, flaxseeds, fortified milks, and eggs. These fats are also found in cold-water fish, such as sardines, salmon, and mackerel. Limit how much you eat of: Canned or prepackaged foods. Food that is high in trans fat, such as some fried foods. Food that is high in saturated fat, such as fatty meat. Desserts and other sweets, sugary drinks, and other foods with added sugar. Full-fat dairy products. Do not salt foods before eating. Do not eat more than 4 egg yolks a week. Try to eat at least 2 vegetarian meals a week. Eat more home-cooked food and less restaurant, buffet, and fast food. Lifestyle When eating at a restaurant, ask that your food be prepared with less salt or no salt, if possible. If you drink  alcohol: Limit how much you use to: 0-1 drink a day for women who are not pregnant. 0-2 drinks a day for men. Be aware of how much alcohol is in your drink. In the [place] one drink equals one 12 oz bottle of beer (355 mL), one 5 oz glass of wine (148 mL), or one 1 oz glass of hard liquor (44 mL). General information Avoid eating more than 2,300 mg of salt a day. If you have hypertension, you may need to reduce your sodium intake to 1,500 mg a day. Work with your health care provider to maintain a healthy body weight or to lose weight. Ask what an ideal weight is for you. Get at least 30 minutes of exercise that causes your heart to beat faster (aerobic exercise) most days of the week. Activities may include walking, swimming, or biking. Work with your health care provider or dietitian to adjust your eating plan to your individual calorie needs. What foods should I eat? Fruits All fresh, dried, or frozen fruit. Canned fruit in natural juice (without added sugar). Vegetables Fresh or frozen vegetables (raw, steamed, roasted, or grilled). Low-sodium or reduced-sodium tomato and vegetable juice. Low-sodium or reduced-sodium tomato sauce and tomato paste. Low-sodium or reduced-sodium canned vegetables. Grains Whole-grain or whole-wheat bread. Whole-grain or  whole-wheat pasta. Brown rice. [place]. Bulgur. Whole-grain and low-sodium cereals. Pita bread. Low-fat, low-sodium crackers. Whole-wheat flour tortillas. Meats and other proteins Skinless chicken or [place]. Ground chicken or [place]. Pork with fat trimmed off. Fish and seafood. Egg whites. Dried beans, peas, or lentils. Unsalted nuts, nut butters, and seeds. Unsalted canned beans. Lean cuts of beef with fat trimmed off. Low-sodium, lean precooked or cured meat, such as sausages or meat loaves. Dairy Low-fat (1%) or fat-free (skim) milk. Reduced-fat, low-fat, or fat-free cheeses. Nonfat, low-sodium ricotta or cottage cheese. Low-fat or  nonfat yogurt. Low-fat, low-sodium cheese. Fats and oils Soft margarine without trans fats. Vegetable oil. Reduced-fat, low-fat, or light mayonnaise and salad dressings (reduced-sodium). Canola, safflower, olive, avocado, soybean, and sunflower oils. Avocado. Seasonings and condiments Herbs. Spices. Seasoning mixes without salt. Other foods Unsalted popcorn and pretzels. Fat-free sweets. The items listed above may not be a complete list of foods and beverages you can eat. Contact a dietitian for more information. What foods should I avoid? Fruits Canned fruit in a light or heavy syrup. Fried fruit. Fruit in cream or butter sauce. Vegetables Creamed or fried vegetables. Vegetables in a cheese sauce. Regular canned vegetables (not low-sodium or reduced-sodium). Regular canned tomato sauce and paste (not low-sodium or reduced-sodium). Regular tomato and vegetable juice (not low-sodium or reduced-sodium). Angie Fava. Olives. Grains Baked goods made with fat, such as croissants, muffins, or some breads. Dry pasta or rice meal packs. Meats and other proteins Fatty cuts of meat. Ribs. Fried meat. Berniece Salines. Bologna, salami, and other precooked or cured meats, such as sausages or meat loaves. Fat from the back of a pig (fatback). Bratwurst. Salted nuts and seeds. Canned beans with added salt. Canned or smoked fish. Whole eggs or egg yolks. Chicken or [place] with skin. Dairy Whole or 2% milk, cream, and half-and-half. Whole or full-fat cream cheese. Whole-fat or sweetened yogurt. Full-fat cheese. Nondairy creamers. Whipped toppings. Processed cheese and cheese spreads. Fats and oils Butter. Stick margarine. Lard. Shortening. Ghee. Bacon fat. Tropical oils, such as coconut, palm kernel, or palm oil. Seasonings and condiments Onion salt, garlic salt, seasoned salt, table salt, and sea salt. Worcestershire sauce. Tartar sauce. Barbecue sauce. Teriyaki sauce. Soy sauce, including reduced-sodium. Steak sauce.  Canned and packaged gravies. Fish sauce. Oyster sauce. Cocktail sauce. Store-bought horseradish. Ketchup. Mustard. Meat flavorings and tenderizers. Bouillon cubes. Hot sauces. Pre-made or packaged marinades. Pre-made or packaged taco seasonings. Relishes. Regular salad dressings. Other foods Salted popcorn and pretzels. The items listed above may not be a complete list of foods and beverages you should avoid. Contact a dietitian for more information. Where to find more information National Heart, Lung, and Blood Institute: https://wilson-eaton.com/ American Heart Association: www.heart.org Academy of Nutrition and Dietetics: www.eatright.[place]: www.kidney.org Summary The DASH eating plan is a healthy eating plan that has been shown to reduce high blood pressure (hypertension). It may also reduce your risk for type 2 diabetes, heart disease, and stroke. When on the DASH eating plan, aim to eat more fresh fruits and vegetables, whole grains, lean proteins, low-fat dairy, and heart-healthy fats. With the DASH eating plan, you should limit salt (sodium) intake to 2,300 mg a day. If you have hypertension, you may need to reduce your sodium intake to 1,500 mg a day. Work with your health care provider or dietitian to adjust your eating plan to your individual calorie needs. This information is not intended to replace advice given to you by your health care provider. Make sure  you discuss any questions you have with your health care provider. Document Revised: 03/12/2019 Document Reviewed: 03/12/2019 Elsevier Patient Education  [place].

## 2022-05-24 LAB — CBC WITH DIFFERENTIAL/PLATELET
Basophils Absolute: 0.1 10*3/uL (ref 0.0–0.2)
Basos: 1 %
EOS (ABSOLUTE): 0.2 10*3/uL (ref 0.0–0.4)
Eos: 4 %
Hematocrit: 39.4 % (ref 37.5–51.0)
Hemoglobin: 13 g/dL (ref 13.0–17.7)
Immature Grans (Abs): 0 10*3/uL (ref 0.0–0.1)
Immature Granulocytes: 0 %
Lymphocytes Absolute: 1.9 10*3/uL (ref 0.7–3.1)
Lymphs: 30 %
MCH: 28.6 pg (ref 26.6–33.0)
MCHC: 33 g/dL (ref 31.5–35.7)
MCV: 87 fL (ref 79–97)
Monocytes Absolute: 0.5 10*3/uL (ref 0.1–0.9)
Monocytes: 8 %
Neutrophils Absolute: 3.6 10*3/uL (ref 1.4–7.0)
Neutrophils: 57 %
Platelets: 404 10*3/uL (ref 150–450)
RBC: 4.55 x10E6/uL (ref 4.14–5.80)
RDW: 15.8 % — ABNORMAL HIGH (ref 11.6–15.4)
WBC: 6.4 10*3/uL (ref 3.4–10.8)

## 2022-05-24 LAB — COMPREHENSIVE METABOLIC PANEL
ALT: 18 IU/L (ref 0–44)
AST: 19 IU/L (ref 0–40)
Albumin/Globulin Ratio: 1.9 (ref 1.2–2.2)
Albumin: 4.7 g/dL (ref 3.8–4.9)
Alkaline Phosphatase: 71 IU/L (ref 44–121)
BUN/Creatinine Ratio: 9 (ref 9–20)
BUN: 9 mg/dL (ref 6–24)
Bilirubin Total: <0.2 mg/dL (ref 0.0–1.2)
CO2: 23 mmol/L (ref 20–29)
Calcium: 9.6 mg/dL (ref 8.7–10.2)
Chloride: 104 mmol/L (ref 96–106)
Creatinine, Ser: 0.99 mg/dL (ref 0.76–1.27)
Globulin, Total: 2.5 g/dL (ref 1.5–4.5)
Glucose: 114 mg/dL — ABNORMAL HIGH (ref 70–99)
Potassium: 3.6 mmol/L (ref 3.5–5.2)
Sodium: 142 mmol/L (ref 134–144)
Total Protein: 7.2 g/dL (ref 6.0–8.5)
eGFR: 91 mL/min/{1.73_m2} (ref >59)

## 2022-05-24 LAB — HEMOGLOBIN A1C
Est. average glucose Bld gHb Est-mCnc: 126 mg/dL
Hgb A1c MFr Bld: 6 % — ABNORMAL HIGH (ref 4.8–5.6)

## 2022-05-24 LAB — LIPID PANEL
Chol/HDL Ratio: 5.3 ratio — ABNORMAL HIGH (ref 0.0–5.0)
Cholesterol, Total: 189 mg/dL (ref 100–199)
HDL: 36 mg/dL — ABNORMAL LOW (ref >39)
LDL Chol Calc (NIH): 119 mg/dL — ABNORMAL HIGH (ref 0–99)
Triglycerides: 193 mg/dL — ABNORMAL HIGH (ref 0–149)
VLDL Cholesterol Cal: 34 mg/dL (ref 5–40)

## 2022-05-24 LAB — PSA: Prostate Specific Ag, Serum: 0.5 ng/mL (ref 0.0–4.0)

## 2022-05-29 ENCOUNTER — Ambulatory Visit: Payer: Self-pay | Admitting: Licensed Clinical Social Worker

## 2022-05-29 DIAGNOSIS — Z7689 Persons encountering health services in other specified circumstances: Secondary | ICD-10-CM

## 2022-05-29 NOTE — BH Specialist Note (Unsigned)
ADULT Comprehensive Clinical Assessment (CCA) Note   05/29/2022 TAFT CONROW [id number]   Referring Provider: Carlyon Shadow, NP Session Start time: No data recorded   Session End time: No data recorded Total time in minutes: No data recorded  SUBJECTIVE: Raymond Short is a 52 y.o.   male accompanied by  himself   HART BART was seen in consultation at the request of Jeanie Sewer, NP for evaluation of  mental health .  Types of Service: Comprehensive Clinical Assessment (CCA)  Reason for referral in patient/family's own words:  The patient stated, " I am a resident in the [facility] program and     He likes to be called Raymond Short.  He came to the appointment with  himself .  Primary language at home is [place].  Constitutional Appearance: cooperative, well-nourished, well-developed, alert and well-appearing  (Patient to answer as appropriate) Gender identity: Male Sex assigned at birth: Male Pronouns: he    Mental status exam:   General Appearance Raymond Short:  Casual Eye Contact:  Good Motor Behavior:  Normal Speech:  Normal Level of Consciousness:  Alert Mood:  Euthymic Affect:  Appropriate Anxiety Level:  Moderate Thought Process:  Coherent Thought Content:  WNL Perception:  Normal Judgment:  Good Insight:  Present   Current Medications and therapies: He is taking:   Outpatient Encounter Medications as of 05/29/2022  Medication Sig   amLODipine (NORVASC) 5 MG tablet Take 1 tablet (5 mg total) by mouth daily.   atorvastatin (LIPITOR) 10 MG tablet Take 1 tablet (10 mg total) by mouth daily.   hydrochlorothiazide (HYDRODIURIL) 25 MG tablet Take 1 tablet (25 mg total) by mouth daily.   Melatonin 3 MG CAPS Take 3 capsules (9 mg total) by mouth at bedtime.   metFORMIN (GLUCOPHAGE-XR) 500 MG 24 hr tablet Take 1 tablet (500 mg total) by mouth daily with breakfast.   No facility-administered encounter medications on file as of 05/29/2022.      Therapies:  In the past behavioral therapy, agency unknown  Family history: Family mental illness:  No information Family school achievement history:  No information Other relevant family history:   Father had Alcohol Use Disorder  Social History: Now living with  [facility] . Employment:  Not employed Religious or Spiritual Beliefs: Due to time constraints will assess at follow-up   Negative Mood Concerns He does not make negative statements about self. Self-injury:  No Suicidal ideation:  No Suicide attempt:  No  Additional Anxiety Concerns: Panic attacks:  No Obsessions:  No Compulsions:  No  Stressors:  Divorce, Family conflict, Finances, Housing/homelessness, Job loss/unemployment, Peer relationships, and Separation  Alcohol and/or Substance Use: Have you recently consumed alcohol? no  Have you recently used any drugs?  no  Have you recently consumed any tobacco? no Does patient seem concerned about dependence or abuse of any substance? yes  Substance Use Disorder Checklist:  There is a persistent desire or unsuccesful efforts to cut down or control substance use, Craving, or a strong desire or urge to use the substance, Recurrent substance use resulting is a failure to fulfill major role obligations at work, school, or home, Important social, occupational, or recreational activities are given up or reduced because substance use, and Withdrawal, as manifiested by either of the following: The characteristic withdrawal syndrome for the substance, or: Substance (or a closely related substance) is taken to relieve or avoid withdrawal symptoms  Severity Risk Scoring based on DSM-5 Criteria for Substance Use Disorder. The presence  of at least two (2) criteria in the last 12 months indicate a substance use disorder. The severity of the substance use disorder is defined as:  Mild: Presence of 2-3 criteria Moderate: Presence of 4-5 criteria Severe: Presence of 6 or  more criteria  Traumatic Experiences: History or current traumatic events (natural disaster, house fire, etc.)? no History or current physical trauma?  no History or current emotional trauma?  no History or current sexual trauma?  no History or current domestic or intimate partner violence?  no History of bullying:  no  Risk Assessment: Suicidal or homicidal thoughts?   no Self injurious behaviors?  no Guns in the home?  no  Self Harm Risk Factors: Family or marital conflict, Loss (financial/interpersonal/professional), Substance use disorder, and Unemployment  Self Harm Thoughts?: No  Patient and/or Family's Strengths/Protective Factors: Concrete supports in place (healthy food, safe environments, etc.), Sense of purpose, and Physical Health (exercise, healthy diet, medication compliance, etc.)  Patient's and/or Family's Goals in their own words: The patient stated, "=  Interventions: Interventions utilized:  Supportive Reflection   Patient and/or Family Response:   Standardized Assessments completed: GAD-7 and PHQ 9 GAD-7=16 PHQ-9=16  Patient Centered Plan: Patient is on the following Treatment Plan(s):  TBD  Coordination of Care: Coordination of care with Julian Hy, LCSW, Dr. Octavia Heir, M.D. Psychiatric Consultant, and Carlyon Shadow, NP.   DSM-5 Diagnosis: Polysubstance use disorder  Recommendations for Services/Supports/Treatments:   Progress towards Goals: Other  Treatment Plan Summary: Behavioral Health Clinician will: Assess individual's status and evaluate for psychiatric symptoms  Individual will: Report any thoughts or plans of harming themselves or others  Referral(s): [place] (In Clinic)  Lesli Albee, [place]

## 2022-05-31 ENCOUNTER — Other Ambulatory Visit (HOSPITAL_COMMUNITY): Payer: Self-pay

## 2022-06-05 ENCOUNTER — Other Ambulatory Visit: Payer: Self-pay

## 2022-06-10 NOTE — BH Specialist Note (Incomplete)
ADULT Comprehensive Clinical Assessment (CCA) Note   05/29/2022 LUISDAVID BOUWMAN PB:[id number]   Referring Provider: Carlyon Shadow, NP Session Start time: No data recorded   Session End time: No data recorded Total time in minutes: No data recorded  SUBJECTIVE: GRANTHAM BIXLER is a 52 y.o.   male accompanied by  himself   EUSEVIO SABRA was seen in consultation at the request of Jeanie Sewer, NP for evaluation of  mental health .  Types of Service: Comprehensive Clinical Assessment (CCA)  Reason for referral in patient/family's own words:  The patient stated, " I am a resident in the [facility] and     He likes to be called Legrand Como.  He came to the appointment with  himself .  Primary language at home is [place].  Constitutional Appearance: cooperative, well-nourished, well-developed, alert and well-appearing  (Patient to answer as appropriate) Gender identity: Male Sex assigned at birth: Male Pronouns: he    Mental status exam:   General Appearance Brayton Mars:  Casual Eye Contact:  Good Motor Behavior:  Normal Speech:  Normal Level of Consciousness:  Alert Mood:  Euthymic Affect:  Appropriate Anxiety Level:  Moderate Thought Process:  Coherent Thought Content:  WNL Perception:  Normal Judgment:  Good Insight:  Present   Current Medications and therapies: He is taking:   Outpatient Encounter Medications as of 05/29/2022  Medication Sig  . amLODipine (NORVASC) 5 MG tablet Take 1 tablet (5 mg total) by mouth daily.  Marland Kitchen atorvastatin (LIPITOR) 10 MG tablet Take 1 tablet (10 mg total) by mouth daily.  . hydrochlorothiazide (HYDRODIURIL) 25 MG tablet Take 1 tablet (25 mg total) by mouth daily.  . Melatonin 3 MG CAPS Take 3 capsules (9 mg total) by mouth at bedtime.  . metFORMIN (GLUCOPHAGE-XR) 500 MG 24 hr tablet Take 1 tablet (500 mg total) by mouth daily with breakfast.   No facility-administered encounter medications on file as of 05/29/2022.      Therapies:  In the past behavioral therapy, agency unknown  Family history: Family mental illness:  No information Family school achievement history:  No information Other relevant family history:   Father had Alcohol Use Disorder  Social History: Now living with  [facility]idential Treatment . Employment:  Not employed Religious or Spiritual Beliefs: Due to time constraints will assess at follow-up   Negative Mood Concerns He does not make negative statements about self. Self-injury:  No Suicidal ideation:  No Suicide attempt:  No  Additional Anxiety Concerns: Panic attacks:  No Obsessions:  No Compulsions:  No  Stressors:  Divorce, Family conflict, Finances, Housing/homelessness, Job loss/unemployment, Peer relationships, and Separation  Alcohol and/or Substance Use: Have you recently consumed alcohol? no  Have you recently used any drugs?  no  Have you recently consumed any tobacco? no Does patient seem concerned about dependence or abuse of any substance? yes  Substance Use Disorder Checklist:  There is a persistent desire or unsuccesful efforts to cut down or control substance use, Craving, or a strong desire or urge to use the substance, Recurrent substance use resulting is a failure to fulfill major role obligations at work, school, or home, Important social, occupational, or recreational activities are given up or reduced because substance use, and Withdrawal, as manifiested by either of the following: The characteristic withdrawal syndrome for the substance, or: Substance (or a closely related substance) is taken to relieve or avoid withdrawal symptoms  Severity Risk Scoring based on DSM-5 Criteria for Substance Use Disorder. The presence  of at least two (2) criteria in the last 12 months indicate a substance use disorder. The severity of the substance use disorder is defined as:  Mild: Presence of 2-3 criteria Moderate: Presence of 4-5 criteria Severe: Presence of 6 or  more criteria  Traumatic Experiences: History or current traumatic events (natural disaster, house fire, etc.)? no History or current physical trauma?  no History or current emotional trauma?  no History or current sexual trauma?  no History or current domestic or intimate partner violence?  no History of bullying:  no  Risk Assessment: Suicidal or homicidal thoughts?   no Self injurious behaviors?  no Guns in the home?  no  Self Harm Risk Factors: Family or marital conflict, Loss (financial/interpersonal/professional), Substance use disorder, and Unemployment  Self Harm Thoughts?: No  Patient and/or Family's Strengths/Protective Factors: Concrete supports in place (healthy food, safe environments, etc.), Sense of purpose, and Physical Health (exercise, healthy diet, medication compliance, etc.)  Patient's and/or Family's Goals in their own words: The patient stated, "  Interventions: Interventions utilized:  Supportive Reflection   Patient and/or Family Response:   Standardized Assessments completed: GAD-7 and PHQ 9 GAD-7=16 PHQ-9=16  Summary   Seamus A. Ellenbecker is a 52 year old Black male who presents for a mental health assessment and was referred by Carlyon Shadow, NP of the [facility]. The patient endorsed sleep disturbances that have worsened since going through detox at the end of January. Shanen stated he has difficulty falling asleep and staying asleep. He states that he gets 2-6 hours of sleep per night. The patient reports taking 10 mg of Melatonin with no relief. He described having racing intrusive thoughts, especially during quiet times. He reported struggling with depression and anxiety since adolescence. He noted his symptoms worsened last November when he separated from his wife. He currently is experiencing uncontrollable excessive worrying, trouble relaxing, feeling nervous, irritable, and doom and gloom feelings like something terrible is about to  happen. Micheal stated that he feels depressed every day, has lost interest in activities he previously enjoyed and has difficulty concentrating. The patient endorsed a significant history of substance and alcohol misuse. He used "Crack" Cocaine and marijuana heavily for most of his adult life and drank heavily since high school. He shared that he has been in treatment over ten times; further information is unavailable. Derin stated he was in [place] for a year and that was his longest period of sobriety in his adult life. The patient denied any history of psychiatric hospitalizations. Kermit was preciously prescribed Naproxen, Wellbutrin; Patient could not recall further information.   ROWLEY GROENEWEG is a [facility] and was seen by Carlyon Shadow, Np on 05/23/2022. Avante has a history of Afib, prediabetes,erectile dysfunction, hyperlipidemia, and hypertension. The patient has an unremarkable surgical history.  Jayen was born in [facility] and raised by both of his parents. He is the youngest of six children. The patient described his childhood as, " My needs were met." Sasan's father passed away from liver cirrhosis when he was 52 years old. He noted his mother had to work 2 jobs; 1st and third shift to provide for her family. Bartek did okay  in school and shared he did just enough to pass his classes. He participated in sports and went to the state championship with his football team in [place]. The patient noted that he started using drugs and drinking heavily in high school. He started  selling marijuana in highschool.  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 He had a daughter in 60. The patient continued to use drugs and drink heavily. He noted he moved to [facility] in [place] and was arrested on felony pos of cocaine and served nine months in county jail.  The patient married and they lived together  for 18 years. Last November he and his wife separated. He noted that after his latest hospitalization his sister helped to support him. He has been at [facility] since January 18,2024 and is contemplating when he should leave to focus on finding employment. The patient stated his bothers and sisters are his support system.  The patient endorsed a family history of mental illness and substance use.   Patient Centered Plan: Patient is on the following Treatment Plan(s):  TBD  Coordination of Care: Coordination of care with Julian Hy, LCSW, Dr. Octavia Heir, M.D. Psychiatric Consultant, and Carlyon Shadow, NP.   DSM-5 Diagnosis: Polysubstance use disorder  Recommendations for Services/Supports/Treatments: TBD  Progress towards Goals: Other  Treatment Plan Summary: Behavioral Health Clinician will: Assess individual's status and evaluate for psychiatric symptoms  Individual will: Report any thoughts or plans of harming themselves or others  Referral(s): [place] (In Clinic)  Lesli Albee, [place]

## 2022-06-11 ENCOUNTER — Ambulatory Visit: Payer: Self-pay | Admitting: Licensed Clinical Social Worker

## 2022-06-11 ENCOUNTER — Telehealth: Payer: Self-pay | Admitting: Licensed Clinical Social Worker

## 2022-06-11 NOTE — Telephone Encounter (Signed)
Called the patient during today's scheduled appointment; patient was not able to come to the telephone; left a HIPAA compliant message with the clinic contact information so they may reschedule.

## 2022-06-18 ENCOUNTER — Ambulatory Visit: Payer: Self-pay | Admitting: Licensed Clinical Social Worker

## 2022-06-19 ENCOUNTER — Ambulatory Visit: Payer: Self-pay | Admitting: Gerontology

## 2022-06-20 ENCOUNTER — Ambulatory Visit: Payer: Self-pay | Admitting: Gerontology

## 2022-06-25 ENCOUNTER — Ambulatory Visit: Payer: Self-pay | Admitting: Licensed Clinical Social Worker

## 2022-07-01 ENCOUNTER — Telehealth: Payer: Self-pay | Admitting: Licensed Clinical Social Worker

## 2022-07-01 ENCOUNTER — Ambulatory Visit: Payer: Self-pay | Admitting: Licensed Clinical Social Worker

## 2022-07-01 NOTE — Telephone Encounter (Signed)
Called the patient during today's scheduled appointment; no answer, left a voicemail with the clinic contact information so they may reschedule.   

## 2022-07-16 ENCOUNTER — Telehealth: Payer: Self-pay

## 2022-07-16 NOTE — Telephone Encounter (Signed)
Called pt to offer last visit to pt per [facility]. No answer. Also, Sent end of thrapy letter to pt. Will follow up on 07/30/2022.

## 2022-07-16 NOTE — Telephone Encounter (Signed)
-----   Message from Lesli Albee, [place] sent at 07/09/2022 12:53 PM EDT ----- Regarding: Offer final appointment Good day,   Please call the patient to offer him a final telephone session. If he declines please provide him with information on how to establish care with [facility]. Additionally, please send him a referral letter; he has Medicaid.   Thank You

## 2022-08-07 ENCOUNTER — Telehealth: Payer: Self-pay

## 2022-08-07 NOTE — Telephone Encounter (Signed)
Received two letters back today. Address is incorrect. Need new address in epic.

## 2022-08-14 ENCOUNTER — Ambulatory Visit (HOSPITAL_COMMUNITY)
Admission: EM | Admit: 2022-08-14 | Discharge: 2022-08-14 | Disposition: A | Discharge status: Home / Self Care | Payer: Medicaid Other

## 2022-08-14 NOTE — Progress Notes (Signed)
   08/14/22 0814  [facility] Triage Screening (Walk-ins at [facility] only)  How Did You Hear About [place]? Self  What Is the Reason for Your Visit/Call Today? ROUTINE: Raymond Short is a 52 y/o male that presents to the [facility]. Hx of depression and alcohol use. States that his depression has worsened since his wife abruptly left him, November 2023. Patient has not heard from his spouse since she left. However, says that he has tried to reach out to her several times, with no response. He mentions self medicating with substance use after his spouse left him. Also, he was using substances throughout their relationship which is a reason why she left him.  He started drinking alcohol at the age of 52 years old. Patient reports drinking daily.  Last use of alcohol was January 2023. He also has a history of cocaine use.  No history of suicidal ideations, homicidal ideations, and/or AVH's. He reports the following current depressive symptoms: hopelessness, worthlessness, isolating self from others, and insomnia. Since his spouse left him, patient has not been sleeping well. Appetite is poor. Patient does not have a current psychiatrist/therapist. Previously, prescribed Mitrazipine. He has not taken the medication in several years. However, felt like it worked when he did take the medication. Patient denies hx of psychiatric inpatient admissions. However, received detox/residential treatment at [facility] in [facility] January-February 2023. Currently, he lives with his sister. Employed and has one daughter. Patient states that he is interested in outpatient therapy.  How Long Has This Been Causing You Problems? > than 6 months  Have You Recently Had Any Thoughts About Hurting Yourself? No  Are You Planning to Commit Suicide/Harm Yourself At This time? No  Have you Recently Had Thoughts About Hurting Someone Karolee Ohs? No  Are You Planning To Harm Someone At This Time? No  Are you currently experiencing any auditory, visual or other  hallucinations? No  Have You Used Any Alcohol or Drugs in the Past 24 Hours? No  Do you have any current medical co-morbidities that require immediate attention? No  Clinician description of patient physical appearance/behavior: Patient is calm and cooperative. Casually dressed. Tearful.  What Do You Feel Would Help You the Most Today? Treatment for Depression or other mood problem;Medication(s)  If access to [facility] was not available, would you have sought care in the Emergency Department? No  Determination of Need Routine (7 days)  Options For Referral Outpatient Therapy;Medication Management;Partial Hospitalization;Intensive Outpatient Therapy

## 2022-08-15 ENCOUNTER — Encounter (HOSPITAL_COMMUNITY): Payer: Self-pay | Admitting: Physician Assistant

## 2022-08-15 ENCOUNTER — Ambulatory Visit (INDEPENDENT_AMBULATORY_CARE_PROVIDER_SITE_OTHER): Payer: Medicaid Other | Admitting: Physician Assistant

## 2022-08-15 VITALS — BP 177/118 | HR 67 | Ht 71.0 in | Wt 195.0 lb

## 2022-08-15 DIAGNOSIS — F411 Generalized anxiety disorder: Secondary | ICD-10-CM | POA: Diagnosis not present

## 2022-08-15 DIAGNOSIS — F331 Major depressive disorder, recurrent, moderate: Secondary | ICD-10-CM

## 2022-08-15 MED ORDER — MIRTAZAPINE 7.5 MG PO TABS: ORAL_TABLET | ORAL | 1 refills | Status: AC

## 2022-08-15 NOTE — Progress Notes (Signed)
Psychiatric Initial Adult Assessment   Patient Identification: Raymond Short MRN:  [id number] Date of Evaluation:  08/15/2022 Referral Source: Walk-in Chief Complaint:   Chief Complaint  Patient presents with   Establish Care   Medication Management   Visit Diagnosis:    ICD-10-CM   1. Generalized anxiety disorder  F41.1 mirtazapine (REMERON) 7.5 MG tablet    2. Moderate episode of recurrent major depressive disorder  F33.1 mirtazapine (REMERON) 7.5 MG tablet      History of Present Illness:    Raymond Short is a 52 year old, African-American male with a past psychiatric history significant for major depressive disorder and anxiety who presents to [facility] to establish psychiatric care and for medication management.  Patient presents to the encounter stating that he is trying to get back on mirtazapine.  Patient reports that he has a past history of using illicit substances and alcohol as a crutch for the management of his mood.  Due to his use of alcohol and illicit substances, patient reports that his wife left him and has been without contact with him since November.  Since she left him, patient reports that he hardly sleeps and has probably lost over 20 pounds.  Patient reports that he does not like going to [employer] or [employer] because the couples he sees trigger him.  Patient reports that it has been hard to deal with his sobriety since he does not have his wife to help him along.  Patient reports that he works third shift and as soon as he gets off from work, it is difficult for him to fall asleep due to his racing thoughts.  Patient reports that he received treatment at [facility] and did 30 days.  Patient reports that he was being treated for alcohol and cocaine use.  Since completing treatment, patient reports that he has been going to AA and NA meetings.  Patient reports that he has been without  mirtazapine for a few years.  Patient reports that the medication was effective in managing his depressive symptoms, anxiety, sleep disturbances, and mood when he was taking it.  Patient endorses anxiety to the point of making him believe stuff that is not true.  He reports that he is plagued with over thinking.  Since his wife has left him, patient reports that all he does is go to work and go home.  Patient reports that he does not like going out because it makes his anxiety worse.  Patient endorses hopelessness but tries to reinforce and uplift himself by reminding himself of all the things he has accomplished.  Patient rates his anxiety as 7 or 8 out of 10 and states that his anxiety makes him think delusionally.  Triggers to his anxiety include losing his partner.  Patient endorses the following stressors: uncertainty, guilt, shame, and no contact from his wife.  In addition to anxiety, patient endorses depression and rates his depression as 7 or 8 out of 10.  Patient endorses depressive episodes every day stating that his symptoms occur all day.  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  When being alone, patient reports that he replays thoughts in his head.  Patient endorses the following depressive symptoms: decreased appetite, low mood, self-isolation, rumination, lack of motivation (decreased desire to do activities), hopelessness, and feelings of guilt/worthlessness.  Patient denies a past history of hospitalizations due to mental health but states that he has  been to treatment facilities for his substance use.  Patient denies a past history of suicide attempt. A PHQ-9 screen was performed with the patient scored an 18.  A GAD-7 screen was also performed the patient scored a 19.  Patient is alert and oriented x 4, calm, cooperative, and fully engaged in conversation during the encounter.  Patient describes his mood as a "seesaw" and "pandora's box."   Patient denies suicidal or homicidal ideations.  He further denies auditory or visual hallucinations and does not appear to be responding to internal/external stimuli.  Patient denies paranoia or delusional thoughts.  Patient endorses poor sleep and receives on average 4 hours of sleep per night.  Patient endorses decreased appetite and eats on average 1 meal per day.  Patient reports that he last drank alcohol on the 13th of January.  Patient endorses tobacco use and smokes on average a pack per day.  Patient denies current illicit drug use and states that he last used cocaine back in January.  Associated Signs/Symptoms: Depression Symptoms:  depressed mood, anhedonia, insomnia, psychomotor agitation, psychomotor retardation, fatigue, feelings of worthlessness/guilt, difficulty concentrating, hopelessness, impaired memory, anxiety, panic attacks, loss of energy/fatigue, disturbed sleep, weight loss, decreased labido, decreased appetite, (Hypo) Manic Symptoms:  Delusions, Distractibility, Flight of Ideas, Impulsivity, Irritable Mood, Labiality of Mood, Anxiety Symptoms:  Agoraphobia, Excessive Worry, Panic Symptoms, Social Anxiety, Specific Phobias, Psychotic Symptoms:  Paranoia, PTSD Symptoms: Had a traumatic exposure:  Patient reports that he found his father, dead on a toilet at 72 years of age. Patient also reports that he found his mother dead at the age of 30. Patient reports that he witnessed traumatic things growing in the projects. Had a traumatic exposure in the last month:  N/A Re-experiencing:  Flashbacks Intrusive Thoughts Nightmares Hypervigilance:  Yes Hyperarousal:  Irritability/Anger Sleep Avoidance:  Decreased Interest/Participation Foreshortened Future  Past Psychiatric History:  Patient reports that he has been diagnosed with major depressive disorder and anxiety.  Patient states that he was recently at a treatment facility for substance abuse (alcohol  use/cocaine use).  Patient denies a past history of hospitalization due to mental health  Patient denies a past history of suicide attempt  Patient denies a past history of homicide attempts  Previous Psychotropic Medications: Yes ; patient reports that he has been on Mirtazapine and Wellbutrin  Substance Abuse History in the last 12 months:  Yes.    Consequences of Substance Abuse: Patient reports that he started drinking alcohol when he was 14.  He reports that he started abusing cocaine when he was 19.  Patient recently went to a treatment facility for the management of this substance abuse.  Patient reports that he has been to treatment facilities in the past for the management of substance abuse. Medical Consequences:  Patient denies Legal Consequences:  Patient reports that he received a possession charge (cocaine) back in '97 Family Consequences:  Patient endorses family consequences from his substance use Blackouts:  Patient endorses blacking out in the past DT's: Patient denies Withdrawal Symptoms:   Patient endorses cravings  Past Medical History:  Past Medical History:  Diagnosis Date   History of atrial fibrillation    History of prediabetes    Hypertension    Hypertriglyceridemia 07/24/2015   Polysubstance abuse    History of EtOH, Cocaine   Rhabdomyolysis 09/13/2011    Past Surgical History:  Procedure Laterality Date   CARDIOVERSION     WISDOM TOOTH EXTRACTION      Family Psychiatric History:  Patient denies a family history of psychiatric illness  Family history of suicide attempts: Patient denies Family history of homicide attempts: Patient denies Family history of substance abuse: Patient reports that his father abused alcohol and died of alcoholic cirrhosis of the liver.  Family History:  Family History  Problem Relation Age of Onset   Hypertension Mother    Heart failure Mother    Alcohol abuse Father    Cirrhosis Father    Other Sister         unknown medical history   Other Sister        unknown medical history   Other Sister        unknown medical history   Other Brother        unknown medical history   Other Brother        unknown medical history   Other Brother        unknown medical history   Other Maternal Grandmother        unknown medical history   Stroke Maternal Grandfather    Prostate cancer Maternal Grandfather    Other Paternal Grandmother        unknown medical history   Other Paternal Grandfather        unknown medical history    Social History:   Social History   Socioeconomic History   Marital status: Married    Spouse name: Not on file   Number of children: Not on file   Years of education: Not on file   Highest education level: Not on file  Occupational History   Not on file  Tobacco Use   Smoking status: Every Day    Packs/day: 0.75    Years: 24.00    Additional pack years: 0.00    Total pack years: 18.00    Types: Cigarettes   Smokeless tobacco: Never  Vaping Use   Vaping Use: Never used  Substance and Sexual Activity   Alcohol use: Not Currently    Alcohol/week: 84.0 standard drinks of alcohol    Types: 84 Cans of beer per week    Comment: last use 05/05/22, previously drank 12 pk per day   Drug use: Not Currently    Types: Cocaine    Comment: last use 05/02/22   Sexual activity: Yes  Other Topics Concern   Not on file  Social History Narrative   Not on file   Social Determinants of Health   Financial Resource Strain: Not on file  Food Insecurity: No Food Insecurity (05/23/2022)   Hunger Vital Sign    Worried About Running Out of Food in the Last Year: Never true    Ran Out of Food in the Last Year: Never true  Transportation Needs: No Transportation Needs (05/23/2022)   PRAPARE - Administrator, Civil Service (Medical): No    Lack of Transportation (Non-Medical): No  Physical Activity: Not on file  Stress: Not on file  Social Connections: Not on file     Additional Social History:  Patient endorses social support through his sister.  Patient has one child (daughter).  Patient endorses housing and is currently living with his sister.  Patient is employed as a Sports administrator.  Patient denies past history of military experience.  Patient states that he did some time after being charged for possession of drug paraphernalia in 97.  Patient has his associates degree in Contractor.  Patient denies access to weapons.  Allergies:  No Known Allergies  Metabolic Disorder Labs: Lab Results  Component Value Date   HGBA1C 6.0 (H) 05/23/2022   MPG 111 08/01/2011   MPG 117 (H) 02/13/2010   No results found for: "PROLACTIN" Lab Results  Component Value Date   CHOL 189 05/23/2022   TRIG 193 (H) 05/23/2022   HDL 36 (L) 05/23/2022   CHOLHDL 5.3 (H) 05/23/2022   VLDL NOT CALC 12/01/2015   LDLCALC 119 (H) 05/23/2022   LDLCALC 87 08/16/2019   Lab Results  Component Value Date   TSH 0.886 08/16/2019    Therapeutic Level Labs: No results found for: "LITHIUM" No results found for: "CBMZ" No results found for: "VALPROATE"  Current Medications: Current Outpatient Medications  Medication Sig Dispense Refill   mirtazapine (REMERON) 7.5 MG tablet Take 1 tablet (7.5 mg total) by mouth at bedtime for 4 days, THEN 2 tablets (15 mg total) at bedtime. 60 tablet 1   amLODipine (NORVASC) 5 MG tablet Take 1 tablet (5 mg total) by mouth daily. 30 tablet 0   atorvastatin (LIPITOR) 10 MG tablet Take 1 tablet (10 mg total) by mouth daily. 30 tablet 0   hydrochlorothiazide (HYDRODIURIL) 25 MG tablet Take 1 tablet (25 mg total) by mouth daily. 30 tablet 0   Melatonin 3 MG CAPS Take 3 capsules (9 mg total) by mouth at bedtime. 90 capsule 0   metFORMIN (GLUCOPHAGE-XR) 500 MG 24 hr tablet Take 1 tablet (500 mg total) by mouth daily with breakfast. 30 tablet 0   No current facility-administered medications for this visit.     Musculoskeletal: Strength & Muscle Tone: within normal limits Gait & Station: normal Patient leans: N/A  Psychiatric Specialty Exam: Review of Systems  Psychiatric/Behavioral:  Positive for sleep disturbance. Negative for decreased concentration, dysphoric mood, hallucinations, self-injury and suicidal ideas. The patient is nervous/anxious. The patient is not hyperactive.     Blood pressure (!) 177/118, pulse 67, height 5\' 11"  (1.803 m), weight 195 lb (88.5 kg), SpO2 99 %.Body mass index is 27.2 kg/m.  General Appearance: Casual  Eye Contact:  Good  Speech:  Clear and Coherent and Normal Rate  Volume:  Normal  Mood:  Anxious and Depressed  Affect:  Appropriate  Thought Process:  Coherent, Goal Directed, and Descriptions of Associations: Intact  Orientation:  Full (Time, Place, and Person)  Thought Content:  WDL  Suicidal Thoughts:  No  Homicidal Thoughts:  No  Memory:  Immediate;   Good Recent;   Good Remote;   Good  Judgement:  Good  Insight:  Good  Psychomotor Activity:  Normal  Concentration:  Concentration: Good and Attention Span: Good  Recall:  Good  Fund of Knowledge:Good  Language: Good  Akathisia:  No  Handed:  Right  AIMS (if indicated):  not done  Assets:  Communication Skills Desire for Improvement Financial Resources/Insurance Housing Social Support Transportation Vocational/Educational  ADL's:  Intact  Cognition: WNL  Sleep:  Good   Screenings: GAD-7    Flowsheet Row Office Visit from 08/15/2022 in [facility] from 05/29/2022 [facility] Office Visit from 08/16/2019 in [facility]  Total GAD-7 Score 19 16 0      PHQ2-9    Flowsheet Row Office Visit from 08/15/2022 in [facility] from 05/29/2022 in [facility] Office Visit from 05/23/2022 in [facility] Office Visit from  02/21/2021 in [facility]  from 08/16/2019 in [facility]  PHQ-2 Total Score 5 5 6  0 0  PHQ-9 Total Score 18 16 11  -- --      Flowsheet Row Office Visit from 08/15/2022 in [facility] ED from 08/14/2022 in [facility] ED from 10/27/2021 in [facility] Department at [facility]  C-SSRS RISK CATEGORY No Risk No Risk No Risk       Assessment and Plan:   Raymond Short is a 52 year old, African-American male with a past psychiatric history significant for major depressive disorder and anxiety who presents to [facility] to establish psychiatric care and for medication management.  Presents today encounter requesting to be placed back on mirtazapine for the management of his depressive symptoms, anxiety, and sleep issues.  Patient endorses depression and anxiety attributed to his wife leaving him due to his use of alcohol and cocaine.  Since his wife has left him, patient has received treatment at [facility] and is attending AA and NA meetings.  Patient reports that he has a past history of using mirtazapine and states that it was effective in managing his depressive symptoms, anxiety, and sleep disturbances.  Patient to be placed on mirtazapine 7.5 mg at bedtime for 4 days followed by 15 mg at bedtime for the management of his depressive symptoms and anxiety.  Patient was agreeable to recommendation.  Patient's medication to be e-prescribed to pharmacy of choice.  Provider discussed with patient adverse side effects of the medication prior to the conclusion of the encounter.  Patient vocalized understanding.  Collaboration of Care: Medication Management AEB provider managing patient's psychiatric medications, Primary Care Provider AEB patient being seen by primary care provider, Psychiatrist AEB patient being  seen by a mental health provider at this facility, and Referral or follow-up with counselor/therapist AEB patient to be set up with a licensed clinical social worker at this facility following the conclusion of the encounter  Patient/Guardian was advised Release of Information must be obtained prior to any record release in order to collaborate their care with an outside provider. Patient/Guardian was advised if they have not already done so to contact the registration department to sign all necessary forms in order for [place] to release information regarding their care.   Consent: Patient/Guardian gives verbal consent for treatment and assignment of benefits for services provided during this visit. Patient/Guardian expressed understanding and agreed to proceed.   1. Generalized anxiety disorder  - mirtazapine (REMERON) 7.5 MG tablet; Take 1 tablet (7.5 mg total) by mouth at bedtime for 4 days, THEN 2 tablets (15 mg total) at bedtime.  Dispense: 60 tablet; Refill: 1  2. Moderate episode of recurrent major depressive disorder  - mirtazapine (REMERON) 7.5 MG tablet; Take 1 tablet (7.5 mg total) by mouth at bedtime for 4 days, THEN 2 tablets (15 mg total) at bedtime.  Dispense: 60 tablet; Refill: 1  Patient to follow-up in 6 weeks Provider spent a total of 45 minutes with the patient/reviewing patient's chart  Meta Hatchet, PA 4/25/20241:36 PM

## 2022-09-26 ENCOUNTER — Ambulatory Visit (HOSPITAL_COMMUNITY): Payer: Medicaid Other | Admitting: Clinical

## 2022-09-26 ENCOUNTER — Encounter (HOSPITAL_COMMUNITY): Payer: Medicaid Other | Admitting: Physician Assistant

## 2022-09-28 ENCOUNTER — Other Ambulatory Visit: Payer: Self-pay

## 2022-09-28 ENCOUNTER — Emergency Department (HOSPITAL_BASED_OUTPATIENT_CLINIC_OR_DEPARTMENT_OTHER)
Admission: EM | Admit: 2022-09-28 | Discharge: 2022-09-28 | Disposition: A | Discharge status: Home / Self Care | Payer: Medicaid Other | Attending: Emergency Medicine | Admitting: Emergency Medicine

## 2022-09-28 ENCOUNTER — Encounter (HOSPITAL_BASED_OUTPATIENT_CLINIC_OR_DEPARTMENT_OTHER): Payer: Self-pay

## 2022-09-28 DIAGNOSIS — Z79899 Other long term (current) drug therapy: Secondary | ICD-10-CM | POA: Insufficient documentation

## 2022-09-28 DIAGNOSIS — E876 Hypokalemia: Secondary | ICD-10-CM | POA: Diagnosis not present

## 2022-09-28 DIAGNOSIS — F1491 Cocaine use, unspecified, in remission: Secondary | ICD-10-CM

## 2022-09-28 DIAGNOSIS — F1090 Alcohol use, unspecified, uncomplicated: Secondary | ICD-10-CM | POA: Diagnosis not present

## 2022-09-28 DIAGNOSIS — Z8659 Personal history of other mental and behavioral disorders: Secondary | ICD-10-CM | POA: Insufficient documentation

## 2022-09-28 DIAGNOSIS — I10 Essential (primary) hypertension: Secondary | ICD-10-CM | POA: Insufficient documentation

## 2022-09-28 LAB — CBC
HCT: 41.1 % (ref 39.0–52.0)
Hemoglobin: 13.8 g/dL (ref 13.0–17.0)
MCH: 29.7 pg (ref 26.0–34.0)
MCHC: 33.6 g/dL (ref 30.0–36.0)
MCV: 88.4 fL (ref 80.0–100.0)
Platelets: 274 10*3/uL (ref 150–400)
RBC: 4.65 MIL/uL (ref 4.22–5.81)
RDW: 15 % (ref 11.5–15.5)
WBC: 7.1 10*3/uL (ref 4.0–10.5)
nRBC: 0 % (ref 0.0–0.2)

## 2022-09-28 LAB — BASIC METABOLIC PANEL
Anion gap: 9 (ref 5–15)
BUN: 7 mg/dL (ref 6–20)
CO2: 24 mmol/L (ref 22–32)
Calcium: 8.9 mg/dL (ref 8.9–10.3)
Chloride: 103 mmol/L (ref 98–111)
Creatinine, Ser: 1.05 mg/dL (ref 0.61–1.24)
GFR, Estimated: >60 mL/min (ref >60)
Glucose, Bld: 93 mg/dL (ref 70–99)
Potassium: 3.2 mmol/L — ABNORMAL LOW (ref 3.5–5.1)
Sodium: 136 mmol/L (ref 135–145)

## 2022-09-28 MED ORDER — HYDRALAZINE HCL 25 MG PO TABS
50.0000 mg | ORAL_TABLET | Freq: Once | ORAL | Status: AC
  Administered 2022-09-28: 50 mg via ORAL
  Filled 2022-09-28: qty 2

## 2022-09-28 MED ORDER — POTASSIUM CHLORIDE CRYS ER 20 MEQ PO TBCR: EXTENDED_RELEASE_TABLET | ORAL | 0 refills | Status: AC

## 2022-09-28 NOTE — Discharge Instructions (Addendum)
It was our pleasure to provide your ER care today - we hope that you feel better.  For blood pressure - continue your amlodipine and hctz, limit salt intake, follow heart healthy eating plan. Make sure to avoid alcohol and/or cocaine use.  From today's labs, your potassium level is low - eat plenty of fruits and vegetables, take potassium supplement as prescribed, and follow up with primary care doctor in one week.   Avoid alcohol/drug use as it is harmful to your physical health and mental well-being. See resource guide attached in terms of accessing inpatient or outpatient substance use treatment programs.   Follow up closely with primary care doctor and behavioral health provider in the coming week, including for follow up of your blood pressure.   For mental health issues and/or crisis, you may also go directly to the Behavioral Health Ur[facility] - they are open 24/7 and walk-ins are welcome.    Return to ER if worse, new symptoms, fevers, chest pain, trouble breathing, or other emergency concern.

## 2022-09-28 NOTE — ED Provider Notes (Signed)
[place] [facility] Provider Note   CSN: [id number] Arrival date & time: 09/28/22  1813     History  Chief Complaint  Patient presents with   Hypertension    Raymond Short is a 52 y.o. male.  Pt with w htn, c/o bp is high. Pt indicates was trying to check into [facility] for etoh use treatment, and was told his bp was high. History of htn, states has meds at home (amlodipine/hctz) but has not taken in several weeks - indicates did tolerate ok when did take. Denies headaches. No change in speech or vision, no numbness/weakness or any neurologic symptoms. No chest pain or discomfort. No sob or unusual doe. No swelling.   The history is provided by the patient and medical records.  Hypertension Pertinent negatives include no chest pain, no abdominal pain, no headaches and no shortness of breath.       Home Medications Prior to Admission medications   Medication Sig Start Date End Date Taking? Authorizing Provider  amLODipine (NORVASC) 5 MG tablet Take 1 tablet (5 mg total) by mouth daily. 05/23/22   Iloabachie, Chioma E, NP  atorvastatin (LIPITOR) 10 MG tablet Take 1 tablet (10 mg total) by mouth daily. 05/23/22   Iloabachie, Chioma E, NP  hydrochlorothiazide (HYDRODIURIL) 25 MG tablet Take 1 tablet (25 mg total) by mouth daily. 05/23/22   Iloabachie, Chioma E, NP  Melatonin 3 MG CAPS Take 3 capsules (9 mg total) by mouth at bedtime. 05/23/22   Iloabachie, Chioma E, NP  metFORMIN (GLUCOPHAGE-XR) 500 MG 24 hr tablet Take 1 tablet (500 mg total) by mouth daily with breakfast. 05/23/22   Iloabachie, Chioma E, NP  mirtazapine (REMERON) 7.5 MG tablet Take 1 tablet (7.5 mg total) by mouth at bedtime for 4 days, THEN 2 tablets (15 mg total) at bedtime. 08/15/22 08/19/23  Meta Hatchet, PA      Allergies    Patient has no known allergies.    Review of Systems   Review of Systems  Constitutional:  Negative for fever.  Eyes:  Negative for visual  disturbance.  Respiratory:  Negative for shortness of breath.   Cardiovascular:  Negative for chest pain.  Gastrointestinal:  Negative for abdominal pain, nausea and vomiting.  Genitourinary:  Negative for flank pain.  Musculoskeletal:  Negative for back pain and neck pain.  Skin:  Negative for rash.  Neurological:  Negative for headaches.  Hematological:  Does not bruise/bleed easily.  Psychiatric/Behavioral:  Negative for confusion.     Physical Exam Updated Vital Signs BP (!) 164/108 (BP Location: Right Arm)   Pulse 72   Temp 98.1 F (36.7 C) (Oral)   Resp 18   Ht 1.803 m (5\' 11" )   Wt 83.9 kg   SpO2 100%   BMI 25.80 kg/m  Physical Exam Vitals and nursing note reviewed.  Constitutional:      Appearance: Normal appearance. He is well-developed.  HENT:     Head: Atraumatic.     Nose: Nose normal.     Mouth/Throat:     Mouth: Mucous membranes are moist.  Eyes:     General: No scleral icterus.    Conjunctiva/sclera: Conjunctivae normal.     Pupils: Pupils are equal, round, and reactive to light.  Neck:     Trachea: No tracheal deviation.  Cardiovascular:     Rate and Rhythm: Normal rate and regular rhythm.     Pulses: Normal pulses.  Heart sounds: Normal heart sounds. No murmur heard.    No friction rub. No gallop.  Pulmonary:     Effort: Pulmonary effort is normal. No accessory muscle usage or respiratory distress.     Breath sounds: Normal breath sounds.  Abdominal:     General: There is no distension.     Palpations: Abdomen is soft.     Tenderness: There is no abdominal tenderness.     Comments: No bruit.   Musculoskeletal:        General: No swelling or tenderness.     Cervical back: Normal range of motion and neck supple. No rigidity.     Right lower leg: No edema.     Left lower leg: No edema.  Skin:    General: Skin is warm and dry.     Findings: No rash.  Neurological:     Mental Status: He is alert.     Comments: Alert, speech clear.  Motor/sens grossly intact bil. Steady gait. No tremor or shakes.   Psychiatric:        Mood and Affect: Mood normal.     Comments: Normal mood and affect. Conversant, smiles. Does not appear acutely depressed or despondent. States plans to pursue etoh tx program after being d/c from ED.       ED Results / Procedures / Treatments   Labs (all labs ordered are listed, but only abnormal results are displayed) Results for orders placed or performed during the hospital encounter of 09/28/22  Basic metabolic panel  Result Value Ref Range   Sodium 136 135 - 145 mmol/L   Potassium 3.2 (L) 3.5 - 5.1 mmol/L   Chloride 103 98 - 111 mmol/L   CO2 24 22 - 32 mmol/L   Glucose, Bld 93 70 - 99 mg/dL   BUN 7 6 - 20 mg/dL   Creatinine, Ser 1.61 0.61 - 1.24 mg/dL   Calcium 8.9 8.9 - 09.6 mg/dL   GFR, Estimated >04 >54 mL/min   Anion gap 9 5 - 15  CBC  Result Value Ref Range   WBC 7.1 4.0 - 10.5 K/uL   RBC 4.65 4.22 - 5.81 MIL/uL   Hemoglobin 13.8 13.0 - 17.0 g/dL   HCT 09.8 11.9 - 14.7 %   MCV 88.4 80.0 - 100.0 fL   MCH 29.7 26.0 - 34.0 pg   MCHC 33.6 30.0 - 36.0 g/dL   RDW 82.9 56.2 - 13.0 %   Platelets 274 150 - 400 K/uL   nRBC 0.0 0.0 - 0.2 %    EKG None  Radiology No results found.  Procedures Procedures    Medications Ordered in ED Medications  hydrALAZINE (APRESOLINE) tablet 50 mg (50 mg Oral Given 09/28/22 1845)    ED Course/ Medical Decision Making/ A&P                             Medical Decision Making Problems Addressed: Alcohol use disorder: chronic illness or injury with exacerbation, progression, or side effects of treatment that poses a threat to life or bodily functions Essential hypertension: chronic illness or injury with exacerbation, progression, or side effects of treatment that poses a threat to life or bodily functions History of cocaine use: chronic illness or injury that poses a threat to life or bodily functions Hypokalemia: acute illness or  injury Uncontrolled hypertension: acute illness or injury  Amount and/or Complexity of Data Reviewed External Data Reviewed: notes. Labs: ordered.  Decision-making details documented in ED Course.  Risk Prescription drug management. Decision regarding hospitalization.   Iv ns. Continuous pulse ox and cardiac monitoring. Labs ordered/sent.   Differential diagnosis includes htn, uncontrolled htn, etc. Dispo decision including potential need for admission considered - will get labs and reassess.   Reviewed nursing notes and prior charts for additional history. External reports reviewed. Notes mention hx etoh/cocaine use - pt currently pursuing tx program.   Cardiac monitor: sinus rhythm, rate 84.  Recheck bp is high, hydralazine po.   Labs reviewed/interpreted by me - chem normal except k mildly low. Kcl po.   BP improved, and does not require further emergent lowering.   Pt confirms he has adequate bp meds at home, amlodipine/hctz.  Pt currently appears stable for d/c.   Rec close pcp f/u in one week re recheck bp.   Return precautions provided.           Final Clinical Impression(s) / ED Diagnoses Final diagnoses:  None    Rx / DC Orders ED Discharge Orders     None         Cathren Laine, MD 09/28/22 2008

## 2022-09-28 NOTE — ED Triage Notes (Signed)
Went to [facility] for treatment and they said his BP was too elevated. States hasn't taken his antihypertensives in at least a month. Took a dose of his amlodipine while at [facility] a few hours ago, however his BP wasn't improving. Denies symptoms of hypertension.
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
